# Patient Record
Sex: Female | Born: 1950 | Hispanic: No | Marital: Married | Smoking: Never smoker
Health system: Southern US, Community
[De-identification: ages and names within clinical notes are randomized; demographics above are authoritative.]

## PROBLEM LIST (undated history)

## (undated) DIAGNOSIS — N189 Chronic kidney disease, unspecified: Secondary | ICD-10-CM

## (undated) DIAGNOSIS — E785 Hyperlipidemia, unspecified: Secondary | ICD-10-CM

## (undated) DIAGNOSIS — I10 Essential (primary) hypertension: Secondary | ICD-10-CM

## (undated) DIAGNOSIS — E119 Type 2 diabetes mellitus without complications: Secondary | ICD-10-CM

## (undated) HISTORY — PX: DG THUMB LEFT HAND: HXRAD1658

## (undated) HISTORY — DX: Essential (primary) hypertension: I10

## (undated) HISTORY — DX: Hyperlipidemia, unspecified: E78.5

## (undated) HISTORY — DX: Type 2 diabetes mellitus without complications: E11.9

## (undated) HISTORY — DX: Chronic kidney disease, unspecified: N18.9

---

## 2002-10-28 ENCOUNTER — Other Ambulatory Visit: Admission: RE | Admit: 2002-10-28 | Discharge: 2002-10-28 | Payer: Self-pay | Admitting: Internal Medicine

## 2003-09-23 ENCOUNTER — Encounter: Admission: RE | Admit: 2003-09-23 | Discharge: 2003-12-22 | Payer: Self-pay | Admitting: Internal Medicine

## 2004-01-13 ENCOUNTER — Other Ambulatory Visit: Admission: RE | Admit: 2004-01-13 | Discharge: 2004-01-13 | Payer: Self-pay | Admitting: Internal Medicine

## 2004-01-20 ENCOUNTER — Encounter: Admission: RE | Admit: 2004-01-20 | Discharge: 2004-03-11 | Payer: Self-pay | Admitting: Internal Medicine

## 2005-09-12 ENCOUNTER — Other Ambulatory Visit: Admission: RE | Admit: 2005-09-12 | Discharge: 2005-09-12 | Payer: Self-pay | Admitting: Obstetrics and Gynecology

## 2006-01-26 ENCOUNTER — Encounter: Admission: RE | Admit: 2006-01-26 | Discharge: 2006-04-26 | Payer: Self-pay | Admitting: Family Medicine

## 2006-07-12 ENCOUNTER — Other Ambulatory Visit: Admission: RE | Admit: 2006-07-12 | Discharge: 2006-07-12 | Payer: Self-pay | Admitting: Internal Medicine

## 2009-12-29 ENCOUNTER — Encounter: Admission: RE | Admit: 2009-12-29 | Discharge: 2009-12-29 | Payer: Self-pay | Admitting: Gastroenterology

## 2010-01-30 ENCOUNTER — Encounter: Admission: RE | Admit: 2010-01-30 | Discharge: 2010-01-30 | Payer: Self-pay | Admitting: Orthopedic Surgery

## 2010-12-05 ENCOUNTER — Encounter: Payer: Self-pay | Admitting: Orthopedic Surgery

## 2012-10-17 ENCOUNTER — Other Ambulatory Visit: Payer: Self-pay | Admitting: Family Medicine

## 2012-10-17 DIAGNOSIS — R109 Unspecified abdominal pain: Secondary | ICD-10-CM

## 2012-10-19 ENCOUNTER — Other Ambulatory Visit: Payer: Self-pay

## 2012-10-23 ENCOUNTER — Ambulatory Visit
Admission: RE | Admit: 2012-10-23 | Discharge: 2012-10-23 | Disposition: A | Discharge status: Home / Self Care | Payer: 59 | Source: Ambulatory Visit | Attending: Family Medicine | Admitting: Family Medicine

## 2012-10-23 DIAGNOSIS — R109 Unspecified abdominal pain: Secondary | ICD-10-CM

## 2016-06-08 ENCOUNTER — Other Ambulatory Visit: Payer: Self-pay | Admitting: Cardiovascular Disease

## 2016-06-08 DIAGNOSIS — E782 Mixed hyperlipidemia: Secondary | ICD-10-CM

## 2016-06-08 DIAGNOSIS — I10 Essential (primary) hypertension: Secondary | ICD-10-CM

## 2016-06-08 DIAGNOSIS — Z8249 Family history of ischemic heart disease and other diseases of the circulatory system: Secondary | ICD-10-CM

## 2016-06-13 ENCOUNTER — Other Ambulatory Visit: Payer: Self-pay

## 2016-06-15 ENCOUNTER — Other Ambulatory Visit: Payer: Self-pay

## 2018-06-21 ENCOUNTER — Other Ambulatory Visit (HOSPITAL_COMMUNITY): Payer: Self-pay | Admitting: Nurse Practitioner

## 2018-06-21 DIAGNOSIS — R6881 Early satiety: Secondary | ICD-10-CM

## 2018-08-03 ENCOUNTER — Encounter (HOSPITAL_COMMUNITY): Payer: Self-pay

## 2018-08-03 ENCOUNTER — Ambulatory Visit (HOSPITAL_COMMUNITY)
Admission: RE | Admit: 2018-08-03 | Discharge: 2018-08-03 | Disposition: A | Discharge status: Home / Self Care | Payer: BLUE CROSS/BLUE SHIELD | Source: Ambulatory Visit | Attending: Nurse Practitioner | Admitting: Nurse Practitioner

## 2018-08-03 DIAGNOSIS — R6881 Early satiety: Secondary | ICD-10-CM | POA: Diagnosis not present

## 2018-08-03 MED ORDER — TECHNETIUM TC 99M SULFUR COLLOID
2.1000 | Freq: Once | INTRAVENOUS | Status: AC | PRN
Start: 2018-08-03 — End: 2018-08-03
  Administered 2018-08-03: 2.1 via ORAL

## 2019-06-27 ENCOUNTER — Other Ambulatory Visit: Payer: Self-pay

## 2019-06-27 ENCOUNTER — Ambulatory Visit (HOSPITAL_COMMUNITY)
Admission: RE | Admit: 2019-06-27 | Discharge: 2019-06-27 | Disposition: A | Discharge status: Home / Self Care | Payer: BC Managed Care – PPO | Source: Ambulatory Visit | Attending: Family Medicine | Admitting: Family Medicine

## 2019-06-27 ENCOUNTER — Other Ambulatory Visit (HOSPITAL_COMMUNITY): Payer: Self-pay | Admitting: Family Medicine

## 2019-06-27 DIAGNOSIS — M79605 Pain in left leg: Secondary | ICD-10-CM | POA: Diagnosis not present

## 2019-06-27 DIAGNOSIS — M7989 Other specified soft tissue disorders: Secondary | ICD-10-CM | POA: Diagnosis present

## 2019-06-27 NOTE — Progress Notes (Signed)
Left lower extremity venous duplex completed. Refer to "CV Proc" under chart review to view preliminary results.  06/27/2019 4:46 PM Maudry Mayhew, MHA, RVT, RDCS, RDMS

## 2019-11-01 ENCOUNTER — Ambulatory Visit: Payer: BC Managed Care – PPO | Attending: Internal Medicine

## 2019-11-01 DIAGNOSIS — Z20822 Contact with and (suspected) exposure to covid-19: Secondary | ICD-10-CM

## 2019-11-02 LAB — NOVEL CORONAVIRUS, NAA: SARS-CoV-2, NAA: NOT DETECTED

## 2019-12-23 ENCOUNTER — Ambulatory Visit: Payer: BC Managed Care – PPO

## 2020-02-18 NOTE — Progress Notes (Signed)
Patient referred by Berkley Harvey, NP for palpitations  Subjective:   Tonya Thornton, female    DOB: Feb 21, 1951, 69 y.o.   MRN: [id number]   Chief Complaint  Patient presents with  . Palpitations  . New Patient (Initial Visit)     HPI  69 y.o. [name] with hypertension, type 2 diabetes mellitus, hyperlipidemia, solitary kidney, family history of brain aneurysm, referred for evaluation of palpitations.  She has had several complaints over the last year or so. This includes bilareral leg/calf pain, generalized fatigue, intolerance to cold, long standing headache, and now palpitations and elevated blood pressure. Headache and elevated blood pressure seem to correlate with inadequate sleep. She denies any chest pain or shortness of breath. Patient has uncontrolled hypertension and diabetes. There does seem to be variability to her blood pressure readings, worse after inadequate sleep at night. She was recently told to have PVS on EKG performed by her PCP.   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 Patient enjoys walking, walks up to 2 miles over an hour or so.  She does have family h/o CAD, with her brother having had a MI related cardiac arrest recently. She also has family h.o brain aneurysm, but reportedly underwent brain MRI herself which was negative for aneurysm. She has a lot of anxiety related to her symptoms and worries what would happen if she has similar episodes "while walking on the street".    Past Medical History:  Diagnosis Date  . Chronic kidney disease   . Diabetes mellitus without complication ([place])   . Hyperlipidemia   . Hypertension      Past Surgical History:  Procedure Laterality Date  . DG THUMB LEFT HAND       Social History   Tobacco Use  Smoking Status Never Smoker  Smokeless Tobacco Never Used    Social History   Substance and Sexual Activity  Alcohol Use Not Currently     Family History    Problem Relation Age of Onset  . COPD Mother   . Diabetes Father   . Hypertension Father   . Diabetes Sister   . Liver cancer Brother   . Liver cancer Brother   . Cancer Brother   . Cancer Sister   . Diabetes Sister   . Diabetes Sister      Current Outpatient Medications on File Prior to Visit  Medication Sig Dispense Refill  . aspirin EC 81 MG tablet Take 81 mg by mouth daily.    . Calcium Carbonate-Vitamin D (CALCIUM 600+D PO) Take 1 tablet by mouth daily.    . Cholecalciferol 25 MCG (1000 UT) capsule Take 1 capsule by mouth daily.    . diclofenac Sodium (VOLTAREN) 1 % GEL APPLY TO AFFECTED AREA 2 GRAMS 2 TO 3 TIMES DAILY AS NEEDED    . lisinopril-hydrochlorothiazide (ZESTORETIC) 10-12.5 MG tablet Take 1 tablet by mouth daily.    Marland Kitchen MAGNESIUM CHLORIDE PO Take 1 tablet by mouth daily.    . metFORMIN (GLUCOPHAGE-XR) 500 MG 24 hr tablet Take 3 tablets by mouth daily.    . Multiple Vitamin (MULTIVITAMIN) tablet Take 1 tablet by mouth daily.    . pravastatin (PRAVACHOL) 20 MG tablet Take 1 tablet by mouth daily.     No current facility-administered medications on file prior to visit.    Cardiovascular and other pertinent studies:  EKG 02/19/2020: Sinus rhythm 89 bpm. Nonspecific T wave ivnersion.    Recent labs: 02/13/2020: Glucose  136, BUN/Cr 11/0.74 EGFR normal. Na/K 136/4.5. Rest of the CMP normal H/H 12/38. MCV 84. Platelets 196 Chol 170, TG 97, HDL 57, LDL 94 TSH 3.3 normal   10/2019: HbA1C 7.8%  Review of Systems  Cardiovascular: Positive for palpitations. Negative for chest pain, dyspnea on exertion, leg swelling and syncope.  Neurological: Positive for headaches.  Psychiatric/Behavioral: The patient is nervous/anxious.         Vitals:   02/19/20 1058 02/19/20 1110  BP: (!) 158/111 (!) 173/97  Pulse: 87 (!) 101  Resp: 18   Temp: 98.2 F (36.8 C)   SpO2: 99%      Body mass index is 24.66 kg/m. Filed Weights   02/19/20 1058  Weight: 130 lb 8 oz  (59.2 kg)     Objective:   Physical Exam  Constitutional: She appears well-developed and well-nourished.  Neck: No JVD present.  Cardiovascular: Normal rate, regular rhythm, normal heart sounds and intact distal pulses.  No murmur heard. Pulses:      Dorsalis pedis pulses are 2+ on the right side and 2+ on the left side.       Posterior tibial pulses are 2+ on the right side and 2+ on the left side.  Pulmonary/Chest: Effort normal and breath sounds normal. She has no wheezes. She has no rales.  Musculoskeletal:        General: No edema.  Nursing note and vitals reviewed.       Assessment & Recommendations:   69 y.o. [name] with hypertension, type 2 diabetes mellitus, hyperlipidemia, family history of brain aneurysm, referred for evaluation of palpitations.  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  Recommend 14 day cardiac telemetry to assess PVC burden. Started metoprolol tartarate 25 mg bid.  Hypertension: Uncontrolled. There is significant variation in her blood pressure readings. Arranged for remote patient monitoring. Continue lisinopril-HCTZ 10-12.5 mg for now. I would avoid increasing ACEi dose if possible, as she has congenital solitary kidney.   Cardiac risk stratification: Given her risk factors for CAD and family h/o MI related cardiac arrest, I will obtain calcium score scan. Conitnue Aspirin, statin, risk factor modification.  Type 2 DM: Continue management as per PCP.  F/u in 6 weeks  Thank you for referring the patient to [place]. Please feel free to contact with any questions.  Time spent: 60 min  [place], MD [facility] Pager: [phone] Office: [phone]

## 2020-02-19 ENCOUNTER — Ambulatory Visit: Payer: BC Managed Care – PPO | Admitting: Cardiology

## 2020-02-19 ENCOUNTER — Encounter: Payer: Self-pay | Admitting: Cardiology

## 2020-02-19 ENCOUNTER — Other Ambulatory Visit: Payer: Self-pay

## 2020-02-19 VITALS — BP 173/97 | HR 101 | Temp 98.2°F | Resp 18 | Ht 61.0 in | Wt 130.5 lb

## 2020-02-19 DIAGNOSIS — E782 Mixed hyperlipidemia: Secondary | ICD-10-CM

## 2020-02-19 DIAGNOSIS — Q600 Renal agenesis, unilateral: Secondary | ICD-10-CM

## 2020-02-19 DIAGNOSIS — Z8249 Family history of ischemic heart disease and other diseases of the circulatory system: Secondary | ICD-10-CM | POA: Insufficient documentation

## 2020-02-19 DIAGNOSIS — IMO0002 Reserved for concepts with insufficient information to code with codable children: Secondary | ICD-10-CM | POA: Insufficient documentation

## 2020-02-19 DIAGNOSIS — I10 Essential (primary) hypertension: Secondary | ICD-10-CM | POA: Insufficient documentation

## 2020-02-19 DIAGNOSIS — R002 Palpitations: Secondary | ICD-10-CM | POA: Insufficient documentation

## 2020-02-19 DIAGNOSIS — I493 Ventricular premature depolarization: Secondary | ICD-10-CM

## 2020-02-19 MED ORDER — METOPROLOL TARTRATE 25 MG PO TABS: 25.0000 mg | ORAL_TABLET | Freq: Two times a day (BID) | ORAL | 2 refills | Status: DC

## 2020-02-21 ENCOUNTER — Telehealth: Payer: Self-pay

## 2020-02-21 NOTE — Telephone Encounter (Signed)
Patient wants to know can she skip taking Lisinopril-HCTZ medication if her blood pressure is in the normal range 120/80's. She is concerned that it will drop to low with the addition of Metoprolol.

## 2020-02-21 NOTE — Telephone Encounter (Signed)
Yes. Okay with me.

## 2020-02-24 NOTE — Telephone Encounter (Signed)
Called pt no answer °

## 2020-02-26 ENCOUNTER — Other Ambulatory Visit: Payer: Self-pay

## 2020-02-26 ENCOUNTER — Ambulatory Visit: Payer: BC Managed Care – PPO

## 2020-02-26 DIAGNOSIS — I493 Ventricular premature depolarization: Secondary | ICD-10-CM

## 2020-02-26 DIAGNOSIS — I10 Essential (primary) hypertension: Secondary | ICD-10-CM

## 2020-02-26 DIAGNOSIS — R002 Palpitations: Secondary | ICD-10-CM

## 2020-02-26 NOTE — Telephone Encounter (Signed)
LMTCB

## 2020-02-27 ENCOUNTER — Telehealth: Payer: Self-pay

## 2020-02-27 DIAGNOSIS — E782 Mixed hyperlipidemia: Secondary | ICD-10-CM

## 2020-02-27 MED ORDER — ROSUVASTATIN CALCIUM 20 MG PO TABS: 20.0000 mg | ORAL_TABLET | Freq: Every day | ORAL | 3 refills | Status: DC

## 2020-02-27 NOTE — Telephone Encounter (Signed)
Gave patient her calcium score result, and discussed Dr. Damian Leavell answers to her previous questions. Patient verbalized understanding.

## 2020-02-27 NOTE — Telephone Encounter (Signed)
-----   Message from Morley Kos, [place] sent at 02/27/2020 11:43 AM EDT ----- Normal pumping function of the heart. No severe heart valve abnormalities noted.   Thanks  MJP

## 2020-02-27 NOTE — Telephone Encounter (Signed)
Patient wants to know what her calcium score is. Also, She wants to know if her elevated Bp is due to the calcium build up in her arteries.

## 2020-02-27 NOTE — Progress Notes (Signed)
Normal pumping function of the heart. No severe heart valve abnormalities noted.  ° ° °Thanks °MJP ° °

## 2020-02-27 NOTE — Telephone Encounter (Signed)
-----   Message from [facility], MD sent at 02/26/2020  4:57 PM EDT ----- Moderate calcium noted in heart arteries. Nothing worrisome, but recommend switching pravastatin to a stronger statin to reduce progression. If patient agreeable, please send Crestor 20 mg daily prescription, 30 pillsX2 refills.  Thanks MJP

## 2020-02-27 NOTE — Telephone Encounter (Signed)
Calcium score is 102, which is 50-75th percentile. No calcium in left main coronary artery, which is good.   Yes, elevated blood pressure is likely due to "hardening" of the arteries.  Thanks MJP

## 2020-03-05 ENCOUNTER — Ambulatory Visit: Payer: Self-pay

## 2020-03-05 ENCOUNTER — Other Ambulatory Visit: Payer: Self-pay

## 2020-03-05 ENCOUNTER — Ambulatory Visit (INDEPENDENT_AMBULATORY_CARE_PROVIDER_SITE_OTHER): Payer: Medicare HMO | Admitting: Sports Medicine

## 2020-03-05 VITALS — BP 132/80 | Ht 62.0 in | Wt 129.0 lb

## 2020-03-05 DIAGNOSIS — M25561 Pain in right knee: Secondary | ICD-10-CM

## 2020-03-05 DIAGNOSIS — M1711 Unilateral primary osteoarthritis, right knee: Secondary | ICD-10-CM

## 2020-03-05 NOTE — Patient Instructions (Addendum)
It was great to see you today!  -You have mild osteoarthritis of your left knee. -You have osteoarthritis, a degenerative medial meniscus tear and cyst in your right knee. -Take 2-3 short walks a day as pain allows. -Make sure you ice your knee right after you walk. Do this for 15 minutes. -Do the exercises we showed you today. Try to do them daily. -Wear the knee sleeve when you are up and walking around.  -See [place] again in 4-6 weeks to re-evaluate your knee and repeat the ultrasound. -See [place] sooner for your right shoulder pain.

## 2020-03-07 DIAGNOSIS — M1711 Unilateral primary osteoarthritis, right knee: Secondary | ICD-10-CM | POA: Insufficient documentation

## 2020-03-07 NOTE — Assessment & Plan Note (Signed)
With single kidney and DM avoid NSAIDs Repetitive icing Compression sleeve (she has one that is cloth and not adequate for support) Walking pattern - cut to shorter walks and more frequent ones rather than prolonged Keep pain and tightness to mild HEP of SLR and isometrics  Would like to avoid CSI with her DM but we can do so if pain worsens  Reck 6 weeks

## 2020-03-07 NOTE — Progress Notes (Signed)
CC: Right Knee Pain  Patient likes to stay active with walking For past 2 months walking more painful sometimes in both knees She recently retired from computer work at [employer] While working she would have to get up every hour 2/2 knee stiffness and pain Difficult to go up steps on RT knee A couple of weeks ago she felt a pop and had worse pain Felt that the knee might give out More swelling noted since then  Past Hx Recent palpitations with noted PVCs - better on Lopressor Type 2 DM - recent AIC ~ 7 HTN Single kidney  Social History Tonya Thornton who referred her to me Non smoker  ROS No locking of knee Swelling but no redness Feels unstable with twisting  PE Pleaasnt [place] F in NAD BP 132/80   Ht 5\' 2"  (1.575 m)   Wt 129 lb (58.5 kg)   BMI 23.59 kg/m   RT knee Visible effusion in SPP Flexion painful at 130 deg but full extension Stable ligaments Strength is good  Lt Knee No effusion noted Full extension and flexion to 145 deg Stable ligaments  Ultrasound of Right Knee  Moderate hypoechoic swelling of SPP consistent with effusion Free fragment of cartilage and hypoechoic pseudocyst along medial joint line Spurring noted along joint line and narrowing Lateral joint line with smaller spurs noted Meniscus looks intact Patellar and quadriceps tendons normal  Comparison view of left knee shows no effusion and only minor spurs along joint lines  Impression: Findings consistent with degenerative medial meniscus tear and osteoarthritis of RT knee

## 2020-03-19 ENCOUNTER — Ambulatory Visit: Payer: BC Managed Care – PPO | Admitting: Sports Medicine

## 2020-04-02 ENCOUNTER — Ambulatory Visit: Payer: Medicare HMO | Admitting: Sports Medicine

## 2020-04-04 NOTE — Progress Notes (Signed)
Patient referred by Berkley Harvey, NP for palpitations  Subjective:   Tonya Thornton, female    DOB: Nov 02, 1951, 69 y.o.   MRN: [id number]   Chief Complaint  Patient presents with  . Hypertension  . Palpitations  . Follow-up     HPI  69 y.o. [name] with hypertension, type 2 diabetes mellitus, hyperlipidemia, solitary kidney, family history of brain aneurysm, palpitations.  Blood pressure and palpitations much better controlled with metoprolol. Calcium score elevated, details below. She denies any exertional chest pain, shortness of breath. She recently underwent right knee surgery and is recovering from it.   Initial consultation HPI 02/2020: She has had several complaints over the last year or so. This includes bilareral leg/calf pain, generalized fatigue, intolerance to cold, long standing headache, and now palpitations and elevated blood pressure. Headache and elevated blood pressure seem to correlate with inadequate sleep. She denies any chest pain or shortness of breath. Patient has uncontrolled hypertension and diabetes. There does seem to be variability to her blood pressure readings, worse after inadequate sleep at night. She was recently told to have PVS on EKG performed by her PCP.   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 Patient enjoys walking, walks up to 2 miles over an hour or so.  She does have family h/o CAD, with her brother having had a MI related cardiac arrest recently. She also has family h.o brain aneurysm, but reportedly underwent brain MRI herself which was negative for aneurysm. She has a lot of anxiety related to her symptoms and worries what would happen if she has similar episodes "while walking on the street".     Current Outpatient Medications on File Prior to Visit  Medication Sig Dispense Refill  . aspirin EC 81 MG tablet Take 81 mg by mouth daily.    . Calcium Carbonate-Vitamin D (CALCIUM 600+D PO) Take 1  tablet by mouth daily.    . Cholecalciferol 25 MCG (1000 UT) capsule Take 1 capsule by mouth daily.    . diclofenac Sodium (VOLTAREN) 1 % GEL APPLY TO AFFECTED AREA 2 GRAMS 2 TO 3 TIMES DAILY AS NEEDED    . lisinopril-hydrochlorothiazide (ZESTORETIC) 10-12.5 MG tablet Take 1 tablet by mouth daily.    Marland Kitchen MAGNESIUM CHLORIDE PO Take 1 tablet by mouth daily.    . metFORMIN (GLUCOPHAGE-XR) 500 MG 24 hr tablet Take 3 tablets by mouth daily.    . metoprolol tartrate (LOPRESSOR) 25 MG tablet Take 1 tablet (25 mg total) by mouth 2 (two) times daily. 60 tablet 2  . Multiple Vitamin (MULTIVITAMIN) tablet Take 1 tablet by mouth daily.    . rosuvastatin (CRESTOR) 20 MG tablet Take 1 tablet (20 mg total) by mouth daily. 30 tablet 3   No current facility-administered medications on file prior to visit.    Cardiovascular and other pertinent studies:  Real time outpatient cardiac telemetry 02/26/2020: Monitoring period 319 hr 58 min Dominant rhythm: Sinus. HR 57-117 bpm. Avg HR 75 bpm. Ventricular ectopy 1.3%, with isolated VE and one episode of 6 beat NSVT Rare supraventricular ectopy No atrial fibrillation/atrial flutter/SVT/VT/high grade AV block, sinus pause >3sec noted. Symptoms reported: None  Echocardiogram 02/26/2020:   Normal LV systolic function with EF 61%. Left ventricle cavity is normal  in size. Normal left ventricular wall thickness. Normal global wall  motion. Normal diastolic filling pattern. Calculated EF 61%.  Trileaflet aortic valve. Trace aortic regurgitation.  Structurally normal mitral valve. Mild (Grade I) mitral  regurgitation.  CT cardiac scoring 02/25/2020: Total score: 102 LM: 0 LAD: 86 LCx: 0 RCA: 16  EKG 02/19/2020: Sinus rhythm 89 bpm. Nonspecific T wave ivnersion.   Recent labs: 02/13/2020: Glucose 136, BUN/Cr 11/0.74 EGFR normal. Na/K 136/4.5. Rest of the CMP normal H/H 12/38. MCV 84. Platelets 196 Chol 170, TG 97, HDL 57, LDL 94 TSH 3.3  normal   10/2019: HbA1C 7.8%  Review of Systems  Cardiovascular: Positive for palpitations. Negative for chest pain, dyspnea on exertion, leg swelling and syncope.  Neurological: Positive for headaches.  Psychiatric/Behavioral: The patient is nervous/anxious.         Vitals:   04/06/20 1445  BP: 139/80  Pulse: 71  Resp: 17  SpO2: 99%     Body mass index is 23.78 kg/m. Filed Weights   04/06/20 1445  Weight: 130 lb (59 kg)     Objective:   Physical Exam  Constitutional: She appears well-developed and well-nourished.  Neck: No JVD present.  Cardiovascular: Normal rate, regular rhythm, normal heart sounds and intact distal pulses.  No murmur heard. Pulses:      Dorsalis pedis pulses are 2+ on the right side and 2+ on the left side.       Posterior tibial pulses are 2+ on the right side and 2+ on the left side.  Pulmonary/Chest: Effort normal and breath sounds normal. She has no wheezes. She has no rales.  Musculoskeletal:        General: No edema.  Nursing note and vitals reviewed.       Assessment & Recommendations:   69 y.o. [name] with hypertension, type 2 diabetes mellitus, hyperlipidemia, solitary kidney, family history of brain aneurysm, with  elevated calcium score, symptomatic PVC's  Elevated calcium score: No angina symptoms. Recommend aggressive medial therapy.  Increased crestor to 40 mg. Continue Aspirin 81 mg. Continue hypertension control.   Symptomatic PVC's: Now improved on metoprolol tartarate.  Hypertension: Now controlled.  Type 2 DM: Continue management as per PCP.  F/u in 3 months after repeat lipid panel  Nigel Mormon, MD [facility] Pager: [phone] Office: [phone]

## 2020-04-06 ENCOUNTER — Encounter: Payer: Self-pay | Admitting: Cardiology

## 2020-04-06 ENCOUNTER — Other Ambulatory Visit: Payer: Self-pay

## 2020-04-06 ENCOUNTER — Ambulatory Visit: Payer: Medicare HMO | Admitting: Cardiology

## 2020-04-06 VITALS — BP 139/80 | HR 71 | Resp 17 | Ht 62.0 in | Wt 130.0 lb

## 2020-04-06 DIAGNOSIS — I10 Essential (primary) hypertension: Secondary | ICD-10-CM

## 2020-04-06 DIAGNOSIS — R931 Abnormal findings on diagnostic imaging of heart and coronary circulation: Secondary | ICD-10-CM | POA: Insufficient documentation

## 2020-04-06 DIAGNOSIS — IMO0002 Reserved for concepts with insufficient information to code with codable children: Secondary | ICD-10-CM

## 2020-04-06 DIAGNOSIS — E782 Mixed hyperlipidemia: Secondary | ICD-10-CM

## 2020-04-06 DIAGNOSIS — I493 Ventricular premature depolarization: Secondary | ICD-10-CM

## 2020-04-06 MED ORDER — ROSUVASTATIN CALCIUM 40 MG PO TABS
40.0000 mg | ORAL_TABLET | Freq: Every day | ORAL | 3 refills | Status: DC
Start: 2020-04-06 — End: 2020-06-16

## 2020-04-07 ENCOUNTER — Encounter: Payer: Self-pay | Admitting: Sports Medicine

## 2020-04-07 ENCOUNTER — Ambulatory Visit (INDEPENDENT_AMBULATORY_CARE_PROVIDER_SITE_OTHER): Payer: Medicare HMO | Admitting: Sports Medicine

## 2020-04-07 ENCOUNTER — Ambulatory Visit: Payer: Self-pay

## 2020-04-07 VITALS — BP 140/74 | Ht 62.0 in | Wt 130.0 lb

## 2020-04-07 DIAGNOSIS — G8929 Other chronic pain: Secondary | ICD-10-CM | POA: Diagnosis not present

## 2020-04-07 DIAGNOSIS — M25511 Pain in right shoulder: Secondary | ICD-10-CM

## 2020-04-07 DIAGNOSIS — M1711 Unilateral primary osteoarthritis, right knee: Secondary | ICD-10-CM | POA: Diagnosis not present

## 2020-04-07 NOTE — Patient Instructions (Signed)
Try Tumeric 500 mg once daily. Follow up in 6 weeks.

## 2020-04-07 NOTE — Progress Notes (Signed)
[facility] [address] [place], [place] [address] Phone: [phone] Fax: [phone]   Patient Name: Tonya Thornton Date of Birth: 07-28-1951 Medical Record Number: [id number] Gender: female Date of Encounter: 04/07/2020  SUBJECTIVE:      Chief Complaint:  Follow-up right knee and right shoulder pain   HPI:  Patient is still having pain in her right knee, states it has not worsened.  She is utilizing a cane to ambulate.  She has increased pain when walking up an incline or stairs.  She does still have intermittent locking and catching.  She feels this leg is weaker compared to her left side.  She has been doing her home exercise program diligently.  She denies any numbness or tingling into her toes.  She feels her right lower leg has had a little more swelling. RHD female.  Last fall patient had a fall down the stairs that resulted in a injury to her right shoulder.  States she had an MRI of the right shoulder that showed a subscapularis tear with some sort of biceps pathology as well.  She did 2 months of physical therapy and it improved.  Over the last 2 months she has noticed more pain in the area and has noticed a numbing sensation that will start in her upper arm down the radial side into her thumb.  She was considering having surgery to repair the tendon earlier this year, but her hemoglobin A1c was too high to move forward with surgery.     ROS:     See HPI.   PERTINENT  PMH / PSH / FH / SH:  Past Medical, Surgical, Social, and Family History Reviewed & Updated in the EMR. Pertinent findings include:  OA of right knee, penicillin allergy, taking vitamin D and calcium, DMII   OBJECTIVE:  BP 140/74   Ht 5\' 2"  (1.575 m)   Wt 130 lb (59 kg)   BMI 23.78 kg/m  Physical Exam:  Vital signs are reviewed.   GEN: Alert and oriented, NAD Pulm: Breathing unlabored PSY: normal mood, congruent affect  MSK: Right knee: Noted atrophy of  quadriceps muscle compared to contralateral side on visual examination TTP to bilateral joint lines Decreased ROM in knee flexion Ligaments with solid consistent endpoints including ACL, PCL, LCL, MCL. Positive Mcmurray's and Thessaly tests. Patellar and quadriceps tendons unremarkable. Hamstring and quadriceps strength is normal.  Neurovascularly intact.  Right shoulder Well developed, well nourished, in no acute distress. No swelling, ecchymoses.  No gross deformity. No TTP. FROM. Strength 4/5 in abduction, elbow flexion, horizontal abduction Negative Hawkins, Neers. Negative Yergasons. Negative apprehension. Positive crossarm test NV intact distally.  Limited MSK Ultrasound: Right shoulder No evidence of joint effusion.   The biceps brachii long head tendon and short axis demonstrates moderate swelling around tendon Subscapularis tendon shows intrasubstance tear Supraspinatus insertion shows mild hypoechoic changes Teres minor and infraspinatus visualized without abnormality subacromial bursal abnormality with effusion Posterior labrum is unremarkable AC joint visualized in the long axis with effusion  Impression: Intrasubstance subscapularis tear with partial biceps tear and swelling with AC joint effusion  Ultrasound performed and interpreted by myself and Dr. Peterson Ao B.  Fields, MD   ASSESSMENT & PLAN:   1. Right knee pain  She has improved since last visit, so we will continue with aggressive home therapy and compression.  She can continue to use turmeric.  We advised her to start using Voltaren gel.  She will follow-up with [place]  in 6 weeks.  2.  Chronic right shoulder pain  She is likely compensating for internal rotation with her pectoralis muscle.  She has full function of the shoulder, so at this time we will focus on rotator cuff and bicep strengthening to help her symptoms.  The numbness is likely a result of the swelling pushing on the nerve in the shoulder.  We  will see her back in 6 weeks for reassessment.   Judge Stall, DO, ATC Sports Medicine Fellow  I observed and examined the patient with Dr. Ruta Hinds and agree with assessment and plan.  Note reviewed and modified by me. Sterling Big, MD

## 2020-04-22 ENCOUNTER — Other Ambulatory Visit: Payer: Self-pay

## 2020-04-22 MED ORDER — AMBULATORY NON FORMULARY MEDICATION: 0 refills | Status: DC

## 2020-04-22 NOTE — Progress Notes (Signed)
Pt called asking for a script for a wheel chair for an upcoming trip since she has trouble when doing a lot of walking. She would like it faxed to [facility] in [facility].

## 2020-05-12 ENCOUNTER — Other Ambulatory Visit: Payer: Self-pay | Admitting: Cardiology

## 2020-05-12 ENCOUNTER — Other Ambulatory Visit: Payer: Self-pay | Admitting: Pharmacist

## 2020-05-12 DIAGNOSIS — R002 Palpitations: Secondary | ICD-10-CM

## 2020-05-12 DIAGNOSIS — I10 Essential (primary) hypertension: Secondary | ICD-10-CM

## 2020-05-19 ENCOUNTER — Ambulatory Visit: Payer: Medicare HMO | Admitting: Sports Medicine

## 2020-05-26 ENCOUNTER — Ambulatory Visit (INDEPENDENT_AMBULATORY_CARE_PROVIDER_SITE_OTHER): Payer: Medicare HMO | Admitting: Sports Medicine

## 2020-05-26 ENCOUNTER — Encounter: Payer: Self-pay | Admitting: Sports Medicine

## 2020-05-26 ENCOUNTER — Other Ambulatory Visit: Payer: Self-pay

## 2020-05-26 DIAGNOSIS — M1711 Unilateral primary osteoarthritis, right knee: Secondary | ICD-10-CM | POA: Diagnosis not present

## 2020-05-26 DIAGNOSIS — M25511 Pain in right shoulder: Secondary | ICD-10-CM | POA: Insufficient documentation

## 2020-05-26 DIAGNOSIS — M5136 Other intervertebral disc degeneration, lumbar region: Secondary | ICD-10-CM | POA: Diagnosis not present

## 2020-05-26 NOTE — Assessment & Plan Note (Signed)
Continue to use knee compression when standing or walking Start using your Voltaren gel as this will not harm your kidneys and topical form Continue turmeric Okay to use a wheelchair for rest with longer walking or standing  Okay to continue walking to your pain tolerance Recheck in 3 months

## 2020-05-26 NOTE — Patient Instructions (Signed)
It was great to see you today!  -Start using the voltaren gel again. -Use it at least twice a day on your knee. You can use it up to 4 times a day. -Consider looking at hiking sticks at [employer] They are lightweight and might help you walk with better balance. -Continue Physical Therapy, but when you have used up your visits you can reach out to [facility] at [facility] [employer]. We gave you her card. -For long trips a wheelchair is reasonable to keep up. -Try to keep up activity as best as you can. -Talk to your primary care physician about getting the wheelchair. -See the handout for the exercises we went over with you today.

## 2020-05-26 NOTE — Progress Notes (Signed)
Chief complaint right shoulder pain and low back pain  Patient seen by me 04/07/2020 for right knee arthritis She was at the time having to walk with a cane I suggested a series of exercises We also started formal physical therapy which is really helped  She thinks that walking abnormally has gradually triggered a lot of low back pain She has a history of degenerative disc disease documented on an MRI first in 2011 She states she has had more recent MRIs that showed extension of this Pain occurs with prolonged standing or too much walking  Right shoulder has also been painful She does feel that the physical therapy exercises are helping this  She has started on turmeric for her musculoskeletal pain and thinks it may help somewhat She feels that the Voltaren gel helps but she was hesitant to use too much because of her diabetes  She borrowed a wheelchair for couple of long trips that involved a lot of walking and this really helped Both the back and the knee become very painful if she walks or stands too much  ROS She has reduced walking because of right knee pain but does continue trying to walk some every day She has night pain that makes sleep difficult at times this comes from her low back  Physical examination Pleasant [place] female in no acute distress BP 115/78   Ht 5\' 2"  (1.575 m)   Wt 130 lb (59 kg)   BMI 23.78 kg/m   RT shoulder Mild pain with impingement testing Overall good range of motion Good strength to testing Positive pain on crossover test  Right knee is warm with an obvious suprapatellar effusion Good flexion and extension Left knee does not seem to have an effusion and also has good flexion and extension  Williams flexion exercises tend to relieve some of her back pain She is able to do a branch with no pain

## 2020-05-26 NOTE — Assessment & Plan Note (Signed)
She is doing some exercises for the shoulder which seem helpful I think working with PT is helpful I would like to do more assessment of this in the future including ultrasound scanning

## 2020-05-26 NOTE — Assessment & Plan Note (Signed)
Start with a home flexion program Use pelvic tilts Knee to chest Knee to opposite shoulder Work with physical therapy  Recheck 3 months

## 2020-06-16 ENCOUNTER — Telehealth: Payer: Self-pay

## 2020-06-16 ENCOUNTER — Other Ambulatory Visit: Payer: Self-pay

## 2020-06-16 DIAGNOSIS — E782 Mixed hyperlipidemia: Secondary | ICD-10-CM

## 2020-06-16 MED ORDER — ROSUVASTATIN CALCIUM 20 MG PO TABS
40.0000 mg | ORAL_TABLET | Freq: Every day | ORAL | 3 refills | Status: DC
Start: 2020-06-16 — End: 2020-06-23

## 2020-06-16 NOTE — Telephone Encounter (Signed)
Okay to reduce Crestor to 20 mg.  Thanks MJP

## 2020-06-16 NOTE — Telephone Encounter (Signed)
Pt called and would like to know can she take a lower dose of her Cestor 20 instead 40 due that her PCP did a lipid test and her  LDL 35

## 2020-06-20 ENCOUNTER — Other Ambulatory Visit: Payer: Self-pay | Admitting: Cardiology

## 2020-06-20 DIAGNOSIS — E782 Mixed hyperlipidemia: Secondary | ICD-10-CM

## 2020-07-10 ENCOUNTER — Encounter: Payer: Self-pay | Admitting: Cardiology

## 2020-07-10 ENCOUNTER — Other Ambulatory Visit: Payer: Self-pay

## 2020-07-10 ENCOUNTER — Ambulatory Visit: Payer: BC Managed Care – PPO | Admitting: Cardiology

## 2020-07-10 VITALS — BP 143/78 | HR 76 | Resp 17 | Ht 62.0 in | Wt 128.0 lb

## 2020-07-10 DIAGNOSIS — I493 Ventricular premature depolarization: Secondary | ICD-10-CM

## 2020-07-10 DIAGNOSIS — I10 Essential (primary) hypertension: Secondary | ICD-10-CM

## 2020-07-10 DIAGNOSIS — IMO0002 Reserved for concepts with insufficient information to code with codable children: Secondary | ICD-10-CM

## 2020-07-10 DIAGNOSIS — R931 Abnormal findings on diagnostic imaging of heart and coronary circulation: Secondary | ICD-10-CM

## 2020-07-10 DIAGNOSIS — M79605 Pain in left leg: Secondary | ICD-10-CM

## 2020-07-10 DIAGNOSIS — E782 Mixed hyperlipidemia: Secondary | ICD-10-CM

## 2020-07-10 DIAGNOSIS — M79604 Pain in right leg: Secondary | ICD-10-CM | POA: Insufficient documentation

## 2020-07-10 NOTE — Progress Notes (Signed)
Patient referred by Berkley Harvey, NP for palpitations  Subjective:   Tonya Thornton, female    DOB: Dec 18, 1950, 69 y.o.   MRN: [id number]   Chief Complaint  Patient presents with  . PVCs  . Hyperlipidemia  . Follow-up    3 month     HPI  69 y.o. [name] with hypertension, type 2 diabetes mellitus, hyperlipidemia, solitary kidney, family history of brain aneurysm, palpitations.  Blood pressure and palpitations much better controlled with metoprolol. Calcium score elevated, details below. She is compliant with medical therapy, with improvement in blood pressures. Avg BP has been 114/72 mmHg, HR 75 bpm. She reports bilateral calf pain, L>R, worse with walking.    Initial consultation HPI 02/2020: She has had several complaints over the last year or so. This includes bilareral leg/calf pain, generalized fatigue, intolerance to cold, long standing headache, and now palpitations and elevated blood pressure. Headache and elevated blood pressure seem to correlate with inadequate sleep. She denies any chest pain or shortness of breath. Patient has uncontrolled hypertension and diabetes. There does seem to be variability to her blood pressure readings, worse after inadequate sleep at night. She was recently told to have PVS on EKG performed by her PCP.   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 Patient enjoys walking, walks up to 2 miles over an hour or so.  She does have family h/o CAD, with her brother having had a MI related cardiac arrest recently. She also has family h.o brain aneurysm, but reportedly underwent brain MRI herself which was negative for aneurysm. She has a lot of anxiety related to her symptoms and worries what would happen if she has similar episodes "while walking on the street".     Current Outpatient Medications on File Prior to Visit  Medication Sig Dispense Refill  . AMBULATORY NON FORMULARY MEDICATION Please provide  patient with a wheelchair that has brakes and a back cushion. 1 Units 0  . aspirin EC 81 MG tablet Take 81 mg by mouth daily.    . Calcium Carbonate-Vitamin D (CALCIUM 600+D PO) Take 1 tablet by mouth daily.    . Cholecalciferol 25 MCG (1000 UT) capsule Take 1 capsule by mouth daily.    . diclofenac Sodium (VOLTAREN) 1 % GEL APPLY TO AFFECTED AREA 2 GRAMS 2 TO 3 TIMES DAILY AS NEEDED    . lisinopril-hydrochlorothiazide (ZESTORETIC) 10-12.5 MG tablet Take 1 tablet by mouth daily.    Marland Kitchen MAGNESIUM CHLORIDE PO Take 1 tablet by mouth daily.    . metFORMIN (GLUCOPHAGE-XR) 500 MG 24 hr tablet Take 3 tablets by mouth daily.    . metoprolol tartrate (LOPRESSOR) 25 MG tablet TAKE 1 TABLET BY MOUTH TWICE A DAY 180 tablet 0  . Multiple Vitamin (MULTIVITAMIN) tablet Take 1 tablet by mouth daily.    . rosuvastatin (CRESTOR) 20 MG tablet TAKE 1 TABLET BY MOUTH EVERY DAY 90 tablet 3   No current facility-administered medications on file prior to visit.    Cardiovascular and other pertinent studies:  Real time outpatient cardiac telemetry 02/26/2020: Monitoring period 319 hr 58 min Dominant rhythm: Sinus. HR 57-117 bpm. Avg HR 75 bpm. Ventricular ectopy 1.3%, with isolated VE and one episode of 6 beat NSVT Rare supraventricular ectopy No atrial fibrillation/atrial flutter/SVT/VT/high grade AV block, sinus pause >3sec noted. Symptoms reported: None  Echocardiogram 02/26/2020:   Normal LV systolic function with EF 61%. Left ventricle cavity is normal  in size. Normal left  ventricular wall thickness. Normal global wall  motion. Normal diastolic filling pattern. Calculated EF 61%.  Trileaflet aortic valve. Trace aortic regurgitation.  Structurally normal mitral valve. Mild (Grade I) mitral regurgitation.  CT cardiac scoring 02/25/2020: Total score: 102 LM: 0 LAD: 86 LCx: 0 RCA: 16  EKG 02/19/2020: Sinus rhythm 89 bpm. Nonspecific T wave ivnersion.   Recent labs: 02/13/2020: Glucose 136, BUN/Cr  11/0.74 EGFR normal. Na/K 136/4.5. Rest of the CMP normal H/H 12/38. MCV 84. Platelets 196 Chol 170, TG 97, HDL 57, LDL 94 TSH 3.3 normal   10/2019: HbA1C 7.8%  Review of Systems  Cardiovascular: Positive for palpitations. Negative for chest pain, dyspnea on exertion, leg swelling and syncope.  Neurological: Positive for headaches.  Psychiatric/Behavioral: The patient is nervous/anxious.         Vitals:   07/10/20 1326  BP: (!) 143/78  Pulse: 76  Resp: 17  SpO2: 98%     Body mass index is 23.41 kg/m. Filed Weights   07/10/20 1326  Weight: 128 lb (58.1 kg)     Objective:   Physical Exam Vitals and nursing note reviewed.  Constitutional:      Appearance: She is well-developed.  Neck:     Vascular: No JVD.  Cardiovascular:     Rate and Rhythm: Normal rate and regular rhythm.     Pulses: Intact distal pulses.          Dorsalis pedis pulses are 2+ on the right side and 2+ on the left side.       Posterior tibial pulses are 2+ on the right side and 2+ on the left side.     Heart sounds: Normal heart sounds. No murmur heard.   Pulmonary:     Effort: Pulmonary effort is normal.     Breath sounds: Normal breath sounds. No wheezing or rales.         Assessment & Recommendations:   69 y.o. [name] with hypertension, type 2 diabetes mellitus, hyperlipidemia, solitary kidney, family history of brain aneurysm, with  elevated calcium score, symptomatic PVC's  Elevated calcium score: No angina symptoms. Recommend aggressive medial therapy.  Ok to reduce crestor to 20 mg. Repeat lipid panel with PCP in 6 months. Continue Aspirin 81 mg, which she takes couple times a week.  Continue hypertension control.   Bilateral calf pain: Will check ABI.  Symptomatic PVC's: Now improved on metoprolol tartarate.  Hypertension: Now controlled.  Type 2 DM: Continue management as per PCP.  F/u in 1 year  Nigel Mormon, MD [facility]  [place] Pager: [phone] Office: [phone]

## 2020-08-04 ENCOUNTER — Ambulatory Visit: Payer: Medicare HMO

## 2020-08-04 ENCOUNTER — Other Ambulatory Visit: Payer: Self-pay

## 2020-08-04 DIAGNOSIS — M79605 Pain in left leg: Secondary | ICD-10-CM

## 2020-08-19 ENCOUNTER — Other Ambulatory Visit: Payer: Self-pay | Admitting: Cardiology

## 2020-08-19 DIAGNOSIS — I10 Essential (primary) hypertension: Secondary | ICD-10-CM

## 2020-08-19 DIAGNOSIS — R002 Palpitations: Secondary | ICD-10-CM

## 2020-11-20 ENCOUNTER — Other Ambulatory Visit: Payer: Self-pay

## 2020-11-20 DIAGNOSIS — E782 Mixed hyperlipidemia: Secondary | ICD-10-CM

## 2020-11-20 MED ORDER — ROSUVASTATIN CALCIUM 20 MG PO TABS: 20.0000 mg | ORAL_TABLET | Freq: Every day | ORAL | 1 refills | Status: DC

## 2021-04-15 ENCOUNTER — Other Ambulatory Visit: Payer: Self-pay | Admitting: Cardiology

## 2021-04-15 DIAGNOSIS — E782 Mixed hyperlipidemia: Secondary | ICD-10-CM

## 2021-05-30 ENCOUNTER — Other Ambulatory Visit: Payer: Self-pay | Admitting: Cardiology

## 2021-05-30 DIAGNOSIS — R002 Palpitations: Secondary | ICD-10-CM

## 2021-05-30 DIAGNOSIS — I10 Essential (primary) hypertension: Secondary | ICD-10-CM

## 2021-07-09 ENCOUNTER — Ambulatory Visit: Payer: Medicare HMO | Admitting: Cardiology

## 2021-07-09 ENCOUNTER — Other Ambulatory Visit: Payer: Self-pay

## 2021-07-09 ENCOUNTER — Encounter: Payer: Self-pay | Admitting: Cardiology

## 2021-07-09 VITALS — BP 137/82 | HR 82 | Temp 98.4°F | Ht 62.0 in | Wt 124.8 lb

## 2021-07-09 DIAGNOSIS — E782 Mixed hyperlipidemia: Secondary | ICD-10-CM

## 2021-07-09 DIAGNOSIS — I493 Ventricular premature depolarization: Secondary | ICD-10-CM

## 2021-07-09 DIAGNOSIS — R931 Abnormal findings on diagnostic imaging of heart and coronary circulation: Secondary | ICD-10-CM

## 2021-07-09 DIAGNOSIS — I10 Essential (primary) hypertension: Secondary | ICD-10-CM

## 2021-07-09 DIAGNOSIS — I739 Peripheral vascular disease, unspecified: Secondary | ICD-10-CM

## 2021-07-09 NOTE — Progress Notes (Signed)
Patient referred by Berkley Harvey, NP for palpitations  Subjective:   Tonya Thornton, female    DOB: 06-10-1951, 70 y.o.   MRN: [id number]   Chief Complaint  Patient presents with   pvc's   Follow-up    1 year f/u      HPI  70 y.o. [name] with hypertension, type 2 diabetes mellitus, hyperlipidemia, solitary kidney, family history of brain aneurysm, palpitations.  Patient is doing well. She denies chest pain, shortness of breath, palpitations, leg edema, orthopnea, PND, TIA/syncope. She is trying to walk more. She has occasional leg swelling, bilateral calf pain at night. Blood pressure is well controlled. PVC's are much improved.    Initial consultation HPI 02/2020: She has had several complaints over the last year or so. This includes bilareral leg/calf pain, generalized fatigue, intolerance to cold, long standing headache, and now palpitations and elevated blood pressure. Headache and elevated blood pressure seem to correlate with inadequate sleep. She denies any chest pain or shortness of breath. Patient has uncontrolled hypertension and diabetes. There does seem to be variability to her blood pressure readings, worse after inadequate sleep at night. She was recently told to have PVS on EKG performed by her PCP.   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 Patient enjoys walking, walks up to 2 miles over an hour or so.  She does have family h/o CAD, with her brother having had a MI related cardiac arrest recently. She also has family h.o brain aneurysm, but reportedly underwent brain MRI herself which was negative for aneurysm. She has a lot of anxiety related to her symptoms and worries what would happen if she has similar episodes "while walking on the street".     Current Outpatient Medications on File Prior to Visit  Medication Sig Dispense Refill   Ascorbic Acid (VITAMIN C) 100 MG tablet Take 100 mg by mouth daily.     aspirin EC  81 MG tablet Take 81 mg by mouth daily.     Calcium Carbonate-Vitamin D (CALCIUM 600+D PO) Take 1 tablet by mouth daily.     Cholecalciferol 25 MCG (1000 UT) capsule Take 1 capsule by mouth daily.     diclofenac Sodium (VOLTAREN) 1 % GEL APPLY TO AFFECTED AREA 2 GRAMS 2 TO 3 TIMES DAILY AS NEEDED     lisinopril-hydrochlorothiazide (ZESTORETIC) 10-12.5 MG tablet Take 1 tablet by mouth daily.     MAGNESIUM CHLORIDE PO Take 1 tablet by mouth daily. Every 2 days     metFORMIN (GLUCOPHAGE-XR) 500 MG 24 hr tablet Take 3 tablets by mouth daily.     metoprolol tartrate (LOPRESSOR) 25 MG tablet TAKE 1 TABLET TWICE DAILY 180 tablet 3   Multiple Vitamin (MULTIVITAMIN) tablet Take 1 tablet by mouth daily.     rosuvastatin (CRESTOR) 20 MG tablet TAKE 1 TABLET EVERY DAY 90 tablet 1   No current facility-administered medications on file prior to visit.    Cardiovascular and other pertinent studies:  EKG 07/09/2021: Sinus rhythm 69 bpm  Nonspecific T wave inversion anteroseptal leads  ABI 08/04/2020:  This exam reveals normal perfusion of the right lower extremity (ABI 0.98)  and mildly decreased perfusion of the left lower extremity, noted at the  posterior and anterior tibial artery level (ABI 0.81). There is normal  triphasic waveform in all the vessels at the ankles except left PT,  biphasic waveform.  Real time outpatient cardiac telemetry 02/26/2020: Monitoring period 319 hr 58 min  Dominant rhythm: Sinus. HR 57-117 bpm. Avg HR 75 bpm. Ventricular ectopy 1.3%, with isolated VE and one episode of 6 beat NSVT Rare supraventricular ectopy No atrial fibrillation/atrial flutter/SVT/VT/high grade AV block, sinus pause >3sec noted. Symptoms reported: None   Echocardiogram 02/26/2020:    Normal LV systolic function with EF 61%. Left ventricle cavity is normal  in size. Normal left ventricular wall thickness. Normal global wall  motion. Normal diastolic filling pattern. Calculated EF 61%.   Trileaflet aortic valve.  Trace aortic regurgitation.  Structurally normal mitral valve.  Mild (Grade I) mitral regurgitation.  CT cardiac scoring 02/25/2020: Total score: 102 LM: 0 LAD: 86 LCx: 0 RCA: 16  EKG 02/19/2020: Sinus rhythm 89 bpm. Nonspecific T wave ivnersion.   Recent labs: 06/25/2021: Glucose 125, BUN/Cr 9/0.85. EGFR 74. Na/K 138/4.5. Rest of the CMP normal HbA1C 7.6%  02/13/2020: Glucose 136, BUN/Cr 11/0.74 EGFR normal. Na/K 136/4.5. Rest of the CMP normal H/H 12/38. MCV 84. Platelets 196 Chol 170, TG 97, HDL 57, LDL 94 TSH 3.3 normal   10/2019: HbA1C 7.8%  Review of Systems  Cardiovascular:  Positive for palpitations. Negative for chest pain, dyspnea on exertion, leg swelling and syncope.  Neurological:  Positive for headaches.  Psychiatric/Behavioral:  The patient is nervous/anxious.        Vitals:   07/09/21 1143  BP: 137/82  Pulse: 82  Temp: 98.4 F (36.9 C)  SpO2: 97%     Body mass index is 22.83 kg/m. Filed Weights   07/09/21 1143  Weight: 124 lb 12.8 oz (56.6 kg)     Objective:   Physical Exam Vitals and nursing note reviewed.  Constitutional:      Appearance: She is well-developed.  Neck:     Vascular: No JVD.  Cardiovascular:     Rate and Rhythm: Normal rate and regular rhythm.     Pulses: Intact distal pulses.          Dorsalis pedis pulses are 2+ on the right side and 2+ on the left side.       Posterior tibial pulses are 2+ on the right side and 2+ on the left side.     Heart sounds: Normal heart sounds. No murmur heard. Pulmonary:     Effort: Pulmonary effort is normal.     Breath sounds: Normal breath sounds. No wheezing or rales.        Assessment & Recommendations:   70 y.o. [name] with hypertension, type 2 diabetes mellitus, hyperlipidemia, solitary kidney, family history of brain aneurysm, with  elevated calcium score, symptomatic PVC's  Elevated calcium score: No angina symptoms. Recommend  aggressive medial therapy.  Currently on crestor to 20 mg. Check lipid panel. Continue hypertension control.   Bilateral calf pain: Left PT ABI 0.81 with biphasic waveform Bilateral calf pain unlikely to be due this mild PAD. Encourage walking.  Symptomatic PVC's: Now improved on metoprolol tartarate.  Hypertension: Now controlled.  Type 2 DM: A1C 7.6%. Continue management as per PCP.  F/u in 1 year  Nigel Mormon, MD [facility]. [place] Pager: [phone] Office: [phone]

## 2021-07-13 LAB — LIPID PANEL
Chol/HDL Ratio: 2.6 ratio (ref 0.0–4.4)
Cholesterol, Total: 151 mg/dL (ref 100–199)
HDL: 57 mg/dL (ref >39)
LDL Chol Calc (NIH): 79 mg/dL (ref 0–99)
Triglycerides: 80 mg/dL (ref 0–149)
VLDL Cholesterol Cal: 15 mg/dL (ref 5–40)

## 2021-10-21 ENCOUNTER — Ambulatory Visit: Payer: Medicare HMO | Admitting: Sports Medicine

## 2021-10-21 ENCOUNTER — Ambulatory Visit
Admission: RE | Admit: 2021-10-21 | Discharge: 2021-10-21 | Disposition: A | Discharge status: Home / Self Care | Payer: Medicare HMO | Source: Ambulatory Visit | Attending: Sports Medicine | Admitting: Sports Medicine

## 2021-10-21 ENCOUNTER — Ambulatory Visit: Payer: Self-pay

## 2021-10-21 VITALS — BP 134/82 | Ht 62.0 in | Wt 121.0 lb

## 2021-10-21 DIAGNOSIS — M542 Cervicalgia: Secondary | ICD-10-CM | POA: Diagnosis not present

## 2021-10-21 DIAGNOSIS — M25511 Pain in right shoulder: Secondary | ICD-10-CM

## 2021-10-21 NOTE — Assessment & Plan Note (Signed)
Likely some degenerative changes contributing.  Recommend voltaren gel and HEP per above.  Will also obtain C-spine films to assess extent of degenerative changes.

## 2021-10-21 NOTE — Progress Notes (Signed)
Tonya Thornton is a 70 y.o. female who presents to [facility] today for the following:  Right shoulder pain Chronic pain, has previously seen Dr. Ave Filter for this and had an MRI that was suggestive of a subscapularis tear as well as biceps tendon tear Had an ultrasound here in May 2021 that showed the same Reports that pain had been doing okay, then in December 2021 got worse Had some decreased strength as well that she noted when she was trying to carry things States that in April she was seen by her primary care provider and had a subacromial injection that helped for about 1-1/2 months Then it came back in July and she has been having significant pain since then Pain is mostly in the lateral aspect of her right arm Has noted some atrophy on the lateral aspect of her arm as well that was present prior to her last visit here on 05/26/2020  Neck pain Also reports that in November she went on a long flight to [place] and when she came back she had significant pain, stiffness, spasm in her neck No numbness and tingling No recent injuries States that this is improved and is not as severe, however she continues to have some pain and limited range of motion in her neck   PMH reviewed.  ROS as above. Medications reviewed.  Exam:  BP 134/82   Ht 5\' 2"  (1.575 m)   Wt 121 lb (54.9 kg)   BMI 22.13 kg/m  Gen: Well NAD MSK:  Right shoulder Inspection: There is some slight atrophy noted in the lateral aspect of her arm in the region of her biceps musculature, but there is no Popeye deformity noted on the right.  No bruising. No swelling Palpation is normal with no TTP over [facility] joint or bicipital groove b/l. Full ROM in flexion, abduction, external rotation to 50 degrees on the right and internal rotation to back pocket on the right.  There is full external and internal rotation on the left. NV intact distally b/l Normal scapular function observed b/l Special Tests:  - Impingement: Neg  Hawkins - Supraspinatous: Equivocal empty can - Infraspinatous/Teres Minor: 5/5 strength with ER - Subscapularis: 5/5 strength with IR with some compensation noted - Biceps tendon: Positive Speeds - Labrum: Negative Obriens, negative clunk, good stability - AC Joint: Negative cross arm - Negative apprehension test - No drop arm sign  Neck/Back: - Inspection: no gross deformity or asymmetry, swelling or ecchymosis - Palpation: No TTP  spinous process, There is no TTP of paraspinal musculature, but there is R>L hypertonicity in periscapular musculature - ROM: Slightly limited in all fields 2/2 pain, worse pain with right sidebending and rotation - Strength: 5/5 wrist flexion, extension, biceps flexion, triceps extension. OK sign, interosseus strength intact  - Neuro: sensation intact in the C5-C8 nerve root distribution b/l, - Special testing: negative spurling's  ULTRASOUND: Shoulder, Right  Diagnostic complete ultrasound imaging obtained of patient's right shoulder.  - No obvious evidence of bony deformity or osteophyte development appreciated.  - Long head of the biceps tendon: There is a partial thickness tear with notable prominent osteophyte of the humerus and some slight retraction of the long head of biceps tendon - Pec major insertion visualized without abnormality. - Subscapularis tendon: complete visualization across the width of the insertion point yielded previously noted tearing with some retraction and a seroma - Supraspinatus tendon: complete visualization across the width of the insertion point yielded small area of calcification but no  significant tearing in the long axis view. No evidence of bursal inflammation appreciated.  - Infraspinatus and teres minor tendons: visualization across the width of the insertion points yielded no evidence of tendon thickening, calcification, or tears in the long axis view.  - AC Joint: Visualized with osteophytic edges and moderate  effusion - Posterior Glenohumeral Joint visualized without abnormality. IMPRESSION: findings consistent with previously noted subscapularis tendon tear with retraction and partial long head of biceps tendon tear with some retraction. Asymptomatic AC arthropathy with effusion.   Ultrasound and interpretation by Sibyl Parr. Fields, MD and Luis Abed, DO    No results found.   Assessment and Plan: 1) Pain in joint, shoulder region Previously noted subscapularis tendon tear is retracted and likely causing biceps tendon to move out of groove and rub over the osteophyte noted.  She is very functional and has compensated well with strength.  We will give her topical voltaren gel given she has one kidney and curls, rolls, flys, and IR at 45 degrees, gentle neck and shoulder ROM.  F/U 6 weeks prn.  Neck pain Likely some degenerative changes contributing.  Recommend voltaren gel and HEP per above.  Will also obtain C-spine films to assess extent of degenerative changes.   Luis Abed, D.O.  PGY-4 [place] [facility]  10/21/2021 4:30 PM I observed and examined the patient with the [facility] resident and agree with assessment and plan.  Note reviewed and modified by me. Sterling Big, MD

## 2021-10-21 NOTE — Patient Instructions (Signed)
It was great to see you today!  -Start the exercises we gave you.  -Do some easy motion of your neck. -Get neck x-rays at your earliest convenience. -Try topical voltaren gel up to 4 times a day. -Follow up in 4-6 weeks.

## 2021-10-21 NOTE — Assessment & Plan Note (Signed)
Previously noted subscapularis tendon tear is retracted and likely causing biceps tendon to move out of groove and rub over the osteophyte noted.  She is very functional and has compensated well with strength.  We will give her topical voltaren gel given she has one kidney and curls, rolls, flys, and IR at 45 degrees, gentle neck and shoulder ROM.  F/U 6 weeks prn.

## 2021-11-03 ENCOUNTER — Telehealth (HOSPITAL_COMMUNITY): Payer: Self-pay | Admitting: Family Medicine

## 2021-11-03 NOTE — Telephone Encounter (Signed)
Called in regards to XR results. No answer.  Left VM without identifier that XR looks okay and no changes needed to plan.  Advised to call back if questions.

## 2021-11-14 ENCOUNTER — Other Ambulatory Visit: Payer: Self-pay | Admitting: Cardiology

## 2021-11-14 DIAGNOSIS — E782 Mixed hyperlipidemia: Secondary | ICD-10-CM

## 2021-11-25 ENCOUNTER — Ambulatory Visit: Payer: Medicare HMO | Admitting: Sports Medicine

## 2021-12-16 ENCOUNTER — Ambulatory Visit: Payer: Medicare HMO | Admitting: Sports Medicine

## 2021-12-16 VITALS — BP 140/82 | Ht 62.0 in | Wt 122.0 lb

## 2021-12-16 DIAGNOSIS — M542 Cervicalgia: Secondary | ICD-10-CM

## 2021-12-16 DIAGNOSIS — M25511 Pain in right shoulder: Secondary | ICD-10-CM | POA: Diagnosis not present

## 2021-12-16 NOTE — Progress Notes (Signed)
Tonya Thornton is a 71 y.o. female who presents to [facility] today for the following:  Right shoulder pain follow-up Last seen for the same on 12/8 Performed an ultrasound at that time that saw some tearing and retraction at subscapularis as well as partial tendon tear of the long head of the biceps tendon with some retraction Given home exercises and advised to use topical Voltaren gel Having improvement at this She has been doing her home exercises Still has some pain mostly in the anterior aspect of her shoulder when she stretches the biceps tendon She has been able to carry some weight in this arm which is improved, but she tries to stay less than 2 pounds so that she does not injure it further  Neck pain follow-up Also seen for the same on 12/8 Had an x-ray on 12/9 that showed mild degenerative disc disease and facet hypertrophy throughout Given gentle range of motion exercises for her neck Still having some pain on the right side of her neck that sometimes goes up near the occiput She denies any numbness and tingling She has not been using any Voltaren gel because she was concerned about only having 1 kidney Has been doing Silver sneakers and finds it this makes her feel good She is doing gentle stretching for her neck   PMH reviewed.  ROS as above. Medications reviewed.  Exam:  BP 140/82    Ht 5\' 2"  (1.575 m)    Wt 122 lb (55.3 kg)    BMI 22.31 kg/m  Gen: Well NAD MSK:  Right Shoulder: Inspection reveals no obvious deformity, atrophy, or asymmetry b/l. No bruising. No swelling Palpation is normal with no TTP over [facility] joint or bicipital groove b/l. Full ROM in flexion, abduction, internal/external rotation b/l NV intact distally b/l Special Tests:  - Impingement: Neg Hawkins, empty can sign. - Supraspinatous: Negative empty can - Infraspinatous/Teres Minor: 5/5 strength with ER - Subscapularis: 5/5 strength with IR - Biceps tendon: Positive Speeds - Labrum:  Negative Obriens - No painful arc and no drop arm sign   Neck/Back: - Inspection: no gross deformity or asymmetry, swelling or ecchymosis - Palpation: No TTP spinous process, of right paraspinal musculature into the upper trapezius on the right - ROM: full active ROM of the cervical spine with neck extension, rotation, flexion -pain with extension and left-sided rotation and sidebending - Strength: 5/5 wrist flexion, extension, biceps flexion, triceps extension. OK sign, interosseus strength intact  - Neuro: sensation intact in the C5-C8 nerve root distribution b/l - Special testing: negative spurling's  No results found.   Assessment and Plan: 1) Pain in joint of right shoulder Improving.  Continue home exercises.  Recommended Voltaren gel and reassured her that there is minimal systemic absorption of this will not affect her kidney.  Follow-up in 6 to 8 weeks as needed.  Neck pain This seems to be the most concerning pain for today.  She specifically asked if this could be causing damage to her brain, and we provided reassurance that this pain is not causing damage to her brain.  Recommended using Voltaren gel and provided the reassurance for this as above.  Given isometric cervical exercises as well as trialed in a soft collar to see if she can use this to rest her neck for 30 minutes to an hour or so while she is at home.  Also given exercises to lift her head straight up, hold 10 seconds for 5 sets.  Recommended doing this  2-3 times a day.  Follow-up in 8 weeks.   Luis Abed, D.O.  PGY-4 [place] [facility]  12/16/2021 2:52 PM  I observed and examined the patient with [facility] resident and agree with assessment and plan.  Note reviewed and modified by me. Sterling Big, MD

## 2021-12-16 NOTE — Assessment & Plan Note (Signed)
This seems to be the most concerning pain for today.  She specifically asked if this could be causing damage to her brain, and we provided reassurance that this pain is not causing damage to her brain.  Recommended using Voltaren gel and provided the reassurance for this as above.  Given isometric cervical exercises as well as trialed in a soft collar to see if she can use this to rest her neck for 30 minutes to an hour or so while she is at home.  Also given exercises to lift her head straight up, hold 10 seconds for 5 sets.  Recommended doing this 2-3 times a day.  Follow-up in 8 weeks.

## 2021-12-16 NOTE — Patient Instructions (Signed)
Thank you for coming to see me today. It was a pleasure. Today we talked about:   You can try to use a soft collar throughout the day to give your neck arrest.  You can also lift her head straight up and hold this for 10 seconds.  Do this 5 times.  Try to repeat this 2-3 times a day.  We have also given you strengthening exercises for your neck.  Add this to your exercises.  You can continue your home exercises for your shoulder.  Please follow-up with [place] in 8 weeks.  If you have any questions or concerns, please do not hesitate to call the office at [phone].  Best,   [place] Constable, DO [place]

## 2021-12-16 NOTE — Assessment & Plan Note (Addendum)
Improving.  Continue home exercises.  Recommended Voltaren gel and reassured her that there is minimal systemic absorption of this will not affect her kidney.  Follow-up in 6 to 8 weeks as needed.

## 2022-02-10 ENCOUNTER — Ambulatory Visit: Payer: Medicare HMO | Admitting: Sports Medicine

## 2022-02-10 DIAGNOSIS — M25511 Pain in right shoulder: Secondary | ICD-10-CM | POA: Diagnosis not present

## 2022-02-10 DIAGNOSIS — M25551 Pain in right hip: Secondary | ICD-10-CM | POA: Diagnosis not present

## 2022-02-10 DIAGNOSIS — M1711 Unilateral primary osteoarthritis, right knee: Secondary | ICD-10-CM

## 2022-02-10 NOTE — Progress Notes (Signed)
Patient was instructed in 10 minutes of therapeutic exercises for right leg weakness to improve strength, ROM and function according to my instructions and plan of care by a Certified Athletic Trainer during the office visit.  Proper technique shown and discussed, handout provided.  All questions discussed and answered.  ?

## 2022-02-10 NOTE — Progress Notes (Signed)
CC; F/u of RT shoulder and neck ?RT knee pain ? ?Patient has been doing some silver sneakers ?With HEP and some therapy the neck, posture and muscle tightness are improved ?In her RT shoulder she had a partial biceps and small subscap tear on [place] ?Still some slight pain but does HEP with flexion and rotation and that has helped ? ?She is able to use RT arm but does not want to lift much ? ?Has some lateral hip and RT knee pain ?Previously had degenerative med. Meniscus on RT treated conservatively ?Now walks better and more ?Worred about rt knee bowing ? ?ROS ?No locking, swelling or giving way ? ?PE ?Elderly [place] F in NAD ?BP 122/78   Ht 5\' 2"  (1.575 m)   Wt 124 lb (56.2 kg)   BMI 22.68 kg/m?  ? ?Neck and head position improved but still tends to head forward ?Less spasm over trapezius on RT ?Shoulder motion is good on RT and moderate strength to all tests ? ?Weakness on hip abduction is on RT only ? ?Slight varus change to RT knee ?Gait is supinated on RT ?Standing position shoes supination and IR of RT foot ?

## 2022-02-10 NOTE — Assessment & Plan Note (Signed)
She has [place] [facility] tears in biceps and subscap based on prior US ?Keep her with flexion and ER/IR exercises ?Work on posture ? ?We will reassess this in 6 to 12 wks ?

## 2022-02-10 NOTE — Assessment & Plan Note (Signed)
We added a wedge with lateral lift to lessen the varus shift of RT knee ?Trial with this for 1 month to see if this helps ?

## 2022-03-28 ENCOUNTER — Other Ambulatory Visit (HOSPITAL_BASED_OUTPATIENT_CLINIC_OR_DEPARTMENT_OTHER): Payer: Self-pay

## 2022-04-22 ENCOUNTER — Other Ambulatory Visit: Payer: Self-pay | Admitting: Gastroenterology

## 2022-04-22 DIAGNOSIS — R1310 Dysphagia, unspecified: Secondary | ICD-10-CM

## 2022-04-27 ENCOUNTER — Ambulatory Visit
Admission: RE | Admit: 2022-04-27 | Discharge: 2022-04-27 | Disposition: A | Discharge status: Home / Self Care | Payer: Medicare HMO | Source: Ambulatory Visit | Attending: Gastroenterology | Admitting: Gastroenterology

## 2022-04-27 DIAGNOSIS — R1310 Dysphagia, unspecified: Secondary | ICD-10-CM

## 2022-05-24 ENCOUNTER — Ambulatory Visit: Payer: Medicare HMO | Admitting: Sports Medicine

## 2022-05-24 ENCOUNTER — Ambulatory Visit
Admission: RE | Admit: 2022-05-24 | Discharge: 2022-05-24 | Disposition: A | Discharge status: Home / Self Care | Payer: Medicare HMO | Source: Ambulatory Visit | Attending: Sports Medicine | Admitting: Sports Medicine

## 2022-05-24 VITALS — BP 133/76 | Ht 62.0 in | Wt 124.0 lb

## 2022-05-24 DIAGNOSIS — M25532 Pain in left wrist: Secondary | ICD-10-CM | POA: Diagnosis not present

## 2022-05-24 DIAGNOSIS — M25562 Pain in left knee: Secondary | ICD-10-CM

## 2022-05-24 DIAGNOSIS — R269 Unspecified abnormalities of gait and mobility: Secondary | ICD-10-CM

## 2022-05-24 DIAGNOSIS — G8929 Other chronic pain: Secondary | ICD-10-CM | POA: Diagnosis not present

## 2022-05-24 DIAGNOSIS — M1711 Unilateral primary osteoarthritis, right knee: Secondary | ICD-10-CM | POA: Diagnosis not present

## 2022-05-24 MED ORDER — METHYLPREDNISOLONE ACETATE 40 MG/ML IJ SUSP
40.0000 mg | Freq: Once | INTRAMUSCULAR | Status: AC
Start: 2022-05-24 — End: 2022-05-24
  Administered 2022-05-24: 40 mg via INTRA_ARTICULAR

## 2022-05-24 NOTE — Progress Notes (Addendum)
Subjective:    Patient ID: Tonya Thornton, female    DOB: Oct 23, 1951, 71 y.o.   MRN: [id number]  HPI chief complaint: Bilateral knee pain and left wrist pain  Patient is a very pleasant 71 year old female that comes in today with a couple of different complaints.  Main complaint is bilateral knee pain, left greater than right.  She has a history of a meniscus tear in the right knee which healed with conservative treatment a couple of years ago.  Her left knee most recently started to hurt diffusely without any known trauma.  She has not noticed any swelling.  No mechanical symptoms.  She is also now beginning to experience returning pain in the right knee.  Pain is primarily along the medial joint line on the right.  No radiating pain.  She is also complaining of some ulnar-sided left wrist pain.  Approximately 10 years ago she had surgery on the [facility] joint on this same hand.  Pain in the left wrist is most noticeable with lifting heavy objects.  No trauma.  She is also noting a worsening supination of her right foot.  She was previously given a lateral heel wedge for this which has been slightly helpful.  She is also worried about some pain that she is getting along the lateral aspect of her fifth metatarsal head on the right and she has a bunion on the left foot which is also painful.    Review of Systems As above    Objective:   Physical Exam  Well-developed, well-nourished.  No acute distress  Left wrist: Limited active flexion and extension.  Well-healed surgical incision consistent with her previous [facility] surgery.  She has a small palpable ganglion cyst along the ulnar aspect of her wrist.  It is nonpulsatile.  Nontender to palpation.  Right hip: Smooth painless hip range of motion with a negative logroll  Examination of both knees shows good range of motion.  Trace effusion on the left.  No tenderness to palpation.  Knees are grossly stable to ligamentous exam.  Negative  Thessaly's bilaterally.  Trace patellofemoral crepitus.  Neurovascularly intact distally.  Examination of her gait with her shoes off does show significant supination on the right.  This does correct with an wearing her lateral heel wedge in her shoes.  Also noted on exam is transverse arch collapse bilaterally with widening of the toes, left greater than right.  Also a moderate bunion deformity on the left.        Assessment & Plan:   Bilateral knee pain, left greater than right, secondary to DJD Ganglion cyst, left wrist Right foot supination Bilateral transverse arch collapse  Patient's more symptomatic left knee was injected today with cortisone.  This done atraumatically under sterile technique utilizing an anterior medial approach.  She tolerates this without difficulty.  I had a long talk with her about the importance of hip and quadricep strengthening.  It sounds like she has a pretty good understanding of these exercises but she is quite weak with her sit to stand test.  She will be more diligent about working on strengthening and will follow-up with me again in 4 weeks for reevaluation.  She will also use over-the-counter Voltaren twice daily.  Prior to her follow-up visit with me she will get standing x-rays of her knees so I can evaluate the degree of osteoarthritis present.  For her left wrist we have recommended a body helix compression sleeve.  For her feet, we have  added metatarsal pads to help support her transverse arches in addition to the lateral heel wedge on the right.  This note was dictated using Dragon naturally speaking software and may contain errors in syntax, spelling, or content which have not been identified prior to signing this note.   Consent obtained and verified. Time-out conducted. Noted no overlying erythema, induration, or other signs of local infection. Skin prepped in a sterile fashion. Topical analgesic spray: Ethyl chloride. Joint: Left  knee Needle: 25-gauge 1.5 inch Completed without difficulty. Meds: 3 cc 1% Xylocaine, 1 cc (40 mg) Depo-Medrol  Advised to call if fevers/chills, erythema, induration, drainage, or persistent bleeding.   Addendum: X-rays reviewed.  There is moderately advanced medial compartmental DJD in both knees.  I will discuss this with the patient at her follow-up visit in 4 weeks.

## 2022-05-26 ENCOUNTER — Ambulatory Visit: Payer: Medicare HMO | Admitting: Sports Medicine

## 2022-06-22 ENCOUNTER — Ambulatory Visit: Payer: Medicare HMO | Admitting: Cardiology

## 2022-06-22 ENCOUNTER — Other Ambulatory Visit: Payer: Self-pay | Admitting: Cardiology

## 2022-06-22 ENCOUNTER — Encounter: Payer: Self-pay | Admitting: Cardiology

## 2022-06-22 VITALS — BP 133/78 | HR 72 | Temp 98.0°F | Resp 16 | Ht 62.0 in | Wt 121.0 lb

## 2022-06-22 DIAGNOSIS — M791 Myalgia, unspecified site: Secondary | ICD-10-CM

## 2022-06-22 DIAGNOSIS — I10 Essential (primary) hypertension: Secondary | ICD-10-CM

## 2022-06-22 DIAGNOSIS — E782 Mixed hyperlipidemia: Secondary | ICD-10-CM

## 2022-06-22 DIAGNOSIS — R931 Abnormal findings on diagnostic imaging of heart and coronary circulation: Secondary | ICD-10-CM

## 2022-06-22 DIAGNOSIS — R002 Palpitations: Secondary | ICD-10-CM

## 2022-06-22 NOTE — Progress Notes (Signed)
Patient referred by Berkley Harvey, NP for palpitations  Subjective:   Tonya Thornton, female    DOB: Mar 04, 1951, 71 y.o.   MRN: [id number]   Chief Complaint  Patient presents with   Hypertension   Hyperlipidemia   Follow-up    1 year   Results    Ct score     HPI  71 y.o. [name] with hypertension, type 2 diabetes mellitus, hyperlipidemia, solitary kidney, family history of brain aneurysm, palpitations.  Patient is doing well.  She has only occasional palpitations.  Separately, she has occasional symptoms of, what she calls as a weakness in her feet, and occasional pain on the right side of her head.  She is increasing her physical activity, denies any chest pain.    Reviewed recent test results with the patient, details below.   Initial consultation HPI 02/2020: She has had several complaints over the last year or so. This includes bilareral leg/calf pain, generalized fatigue, intolerance to cold, long standing headache, and now palpitations and elevated blood pressure. Headache and elevated blood pressure seem to correlate with inadequate sleep. She denies any chest pain or shortness of breath. Patient has uncontrolled hypertension and diabetes. There does seem to be variability to her blood pressure readings, worse after inadequate sleep at night. She was recently told to have PVS on EKG performed by her PCP.   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 Patient enjoys walking, walks up to 2 miles over an hour or so.  She does have family h/o CAD, with her brother having had a MI related cardiac arrest recently. She also has family h.o brain aneurysm, but reportedly underwent brain MRI herself which was negative for aneurysm. She has a lot of anxiety related to her symptoms and worries what would happen if she has similar episodes "while walking on the street".     Current Outpatient Medications:    Ascorbic Acid (VITAMIN C) 100 MG  tablet, Take 100 mg by mouth daily., Disp: , Rfl:    Cholecalciferol 25 MCG (1000 UT) capsule, Take 1 capsule by mouth daily., Disp: , Rfl:    diclofenac Sodium (VOLTAREN) 1 % GEL, APPLY TO AFFECTED AREA 2 GRAMS 2 TO 3 TIMES DAILY AS NEEDED, Disp: , Rfl:    lisinopril-hydrochlorothiazide (ZESTORETIC) 10-12.5 MG tablet, Take 1 tablet by mouth daily., Disp: , Rfl:    MAGNESIUM CHLORIDE PO, Take 1 tablet by mouth daily. Every 2 days, Disp: , Rfl:    metFORMIN (GLUCOPHAGE-XR) 500 MG 24 hr tablet, Take 3 tablets by mouth daily., Disp: , Rfl:    metoprolol tartrate (LOPRESSOR) 25 MG tablet, TAKE 1 TABLET TWICE DAILY, Disp: 180 tablet, Rfl: 3   Multiple Vitamin (MULTIVITAMIN) tablet, Take 1 tablet by mouth daily., Disp: , Rfl:    rosuvastatin (CRESTOR) 20 MG tablet, TAKE 1 TABLET EVERY DAY, Disp: 90 tablet, Rfl: 1  Cardiovascular and other pertinent studies:  EKG 06/22/2022: Sinus rhythm 74 bpm Anteroswptal T wave inversio, consider ischemia  Low voltage No change compared to previous EKG  ABI 08/04/2020:  This exam reveals normal perfusion of the right lower extremity (ABI 0.98)  and mildly decreased perfusion of the left lower extremity, noted at the  posterior and anterior tibial artery level (ABI 0.81). There is normal  triphasic waveform in all the vessels at the ankles except left PT,  biphasic waveform.  Real time outpatient cardiac telemetry 02/26/2020: Monitoring period 319 hr 58 min Dominant rhythm:  Sinus. HR 57-117 bpm. Avg HR 75 bpm. Ventricular ectopy 1.3%, with isolated VE and one episode of 6 beat NSVT Rare supraventricular ectopy No atrial fibrillation/atrial flutter/SVT/VT/high grade AV block, sinus pause >3sec noted. Symptoms reported: None   Echocardiogram 02/26/2020:    Normal LV systolic function with EF 61%. Left ventricle cavity is normal  in size. Normal left ventricular wall thickness. Normal global wall  motion. Normal diastolic filling pattern. Calculated EF 61%.   Trileaflet aortic valve.  Trace aortic regurgitation.  Structurally normal mitral valve.  Mild (Grade I) mitral regurgitation.  CT cardiac scoring 02/25/2020: Total score: 102 LM: 0 LAD: 86 LCx: 0 RCA: 16  EKG 02/19/2020: Sinus rhythm 89 bpm. Nonspecific T wave ivnersion.   Recent labs: 03/22/2022: Glucose 120, BUN/Cr 17/0.8. EGFR 79. Na/K 136/4.4. Rest of the CMP normal HbA1C 7.6%  09/2021: H/H 12/37. MCV 83. Platelets 213 Chol 166, TG 95, HDL 56, LDL 91 HbA1C 8.1%  02/13/2020: Chol 170, TG 97, HDL 57, LDL 94   10/2019: HbA1C 7.8%  Review of Systems  Cardiovascular:  Positive for palpitations (occasional). Negative for chest pain, dyspnea on exertion, leg swelling and syncope.  Neurological:  Positive for headaches.  Psychiatric/Behavioral:  The patient is nervous/anxious.         Vitals:   06/22/22 1130  BP: 133/78  Pulse: 72  Resp: 16  Temp: 98 F (36.7 C)  SpO2: 98%     Body mass index is 22.13 kg/m. Filed Weights   06/22/22 1130  Weight: 121 lb (54.9 kg)     Objective:   Physical Exam Vitals and nursing note reviewed.  Constitutional:      Appearance: She is well-developed.  Neck:     Vascular: No JVD.  Cardiovascular:     Rate and Rhythm: Normal rate and regular rhythm.     Pulses: Intact distal pulses.          Dorsalis pedis pulses are 2+ on the right side and 2+ on the left side.       Posterior tibial pulses are 2+ on the right side and 2+ on the left side.     Heart sounds: Normal heart sounds. No murmur heard. Pulmonary:     Effort: Pulmonary effort is normal.     Breath sounds: Normal breath sounds. No wheezing or rales.  Musculoskeletal:     Right lower leg: No edema.     Left lower leg: No edema.  Skin:    Comments: Prominent varicocities         Assessment & Recommendations:   71 y.o. [name] with hypertension, type 2 diabetes mellitus, hyperlipidemia, solitary kidney, family history of brain aneurysm,  with  elevated calcium score, symptomatic PVC's  Elevated calcium score: No angina symptoms. Recommend aggressive medial therapy.  Currently on crestor to 20 mg.  With her elevated chronic calcium score, I recommend LDL goal <70. Recently, she has had some nonspecific muscle weakness in her feet.  I will check CK.  If normal, increase Crestor to 40 mg daily and repeat lipid panel in 3 months.  Bilateral calf pain: Left PT ABI 0.81 with biphasic waveform Bilateral calf pain unlikely to be due this mild PAD. I suspect some of her pain could be due to venous insufficiency.  Recommend compression stockings and follow-up with vein clinic, as she may have had some recurrence of venous insufficiency after prior history of ablation.  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  Hypertension: Well controlled.  Type 2 DM: A1C 7.6%. Continue management as per PCP.  F/u in 3 months  [place], MD [facility] Pager: [phone] Office: [phone]

## 2022-07-26 ENCOUNTER — Ambulatory Visit: Payer: Medicare HMO | Admitting: Sports Medicine

## 2022-07-26 VITALS — BP 112/66 | Ht 62.0 in

## 2022-07-26 DIAGNOSIS — M542 Cervicalgia: Secondary | ICD-10-CM | POA: Diagnosis not present

## 2022-07-26 NOTE — Progress Notes (Signed)
PCP: Iona Hansen, NP  Subjective:   HPI: Patient is a 71 y.o. female here for neck pain.  Patient reports right sided neck pain over the past 10 months. Has been particularly bothersome over the last ~2 weeks. Pain is constant but worse when she first wakes up in the morning. She also describes occasional spasms of the neck muscles without any specific trigger. She has tried gentle ROM activities, heat, and voltaren gel with slight relief. Tries to avoid NSAIDs as she has a solitary kidney but took a few tablets when the pain was severe and this was helpful. No numbness, tingling, or weakness in her arms.   Also here for knee pain follow up. Was seen 05/24/22 for bilateral knee pain, L worse than R. Thought to be secondary to DJD. Had cortisone injection in L knee at that time and was recommended to do home quad/hip strengthening exercises. She reports her knee pain is significantly improved and is not a concern today.  Past Medical History:  Diagnosis Date   Chronic kidney disease    Diabetes mellitus without complication (HCC)    Hyperlipidemia    Hypertension     Current Outpatient Medications on File Prior to Visit  Medication Sig Dispense Refill   Ascorbic Acid (VITAMIN C) 100 MG tablet Take 100 mg by mouth daily.     Calcium Carbonate (CALCIUM 600 PO) Take by mouth.     Cholecalciferol 25 MCG (1000 UT) capsule Take 1 capsule by mouth daily.     diclofenac Sodium (VOLTAREN) 1 % GEL APPLY TO AFFECTED AREA 2 GRAMS 2 TO 3 TIMES DAILY AS NEEDED     Flaxseed, Linseed, (FLAXSEED OIL) 1000 MG CAPS Take by mouth.     folic acid (FOLVITE) 400 MCG tablet Take 400 mcg by mouth daily.     lisinopril-hydrochlorothiazide (ZESTORETIC) 10-12.5 MG tablet Take 1 tablet by mouth daily.     magnesium 30 MG tablet Take 30 mg by mouth 2 (two) times daily.     MAGNESIUM CHLORIDE PO Take 1 tablet by mouth daily. Every 2 days     metFORMIN (GLUCOPHAGE-XR) 500 MG 24 hr tablet Take 3 tablets by mouth  daily.     metoprolol tartrate (LOPRESSOR) 25 MG tablet TAKE 1 TABLET TWICE DAILY 180 tablet 3   Multiple Vitamin (MULTIVITAMIN) tablet Take 1 tablet by mouth daily.     Omega-3 1000 MG CAPS Take by mouth.     rosuvastatin (CRESTOR) 20 MG tablet TAKE 1 TABLET EVERY DAY 90 tablet 1   No current facility-administered medications on file prior to visit.    Past Surgical History:  Procedure Laterality Date   DG THUMB LEFT HAND      Allergies  Allergen Reactions   Onion Other (See Comments)    Raw onions Raw onions    Penicillins Other (See Comments)    There were no vitals taken for this visit.     02/10/2022   10:39 AM 05/24/2022    2:01 PM  [facility] Exercise  Frequency of aerobic exercise (# of days/week) 5 4  Average time in minutes 55 55  Frequency of strengthening activities (# of days/week) 3 3        No data to display              Objective:  Physical Exam:  Gen: NAD, comfortable in exam room Neck: mild cervical kyphosis noted. Inspection otherwise unremarkable. No midline tenderness to palpation. Mild TTP  of right paraspinal musculature, mostly around the level of C3. Slightly limited ROM with lateral bending. Has fairly good ROM with flexion, extension, and rotation although this elicits some discomfort. NVI distally. Shoulders: No TTP. FROM, 5/5 strength with abduction, flexion, internal/external rotation, no painful arc, negative empty can   Assessment & Plan:  1. Right Neck Pain: likely facet mediated pain secondary to DJD, as she had x-ray December 2022 showing multilevel facet hypertrophy. Disc spaces preserved on imaging and she has no radicular symptoms on history. Discussed treatment options including physical therapy vs MRI followed by facet injections. She wishes to pursue both options. Continue heat, voltaren gel, occasional NSAIDs in the meantime. Follow up after MRI imaging.  Maury Dus, MD PGY-3, [facility]  Patient seen and evaluated with the resident.  I agree with the above plan of care.  Patient would like to start some physical therapy and is also contemplating cervical facet injections.  Therefore, we will order a preprocedural MRI and she will follow-up with me in the office after the study to discuss those results and schedule the injections.

## 2022-07-28 ENCOUNTER — Ambulatory Visit: Payer: Medicare HMO

## 2022-07-28 DIAGNOSIS — I10 Essential (primary) hypertension: Secondary | ICD-10-CM

## 2022-08-03 ENCOUNTER — Ambulatory Visit: Payer: Medicare HMO

## 2022-08-08 ENCOUNTER — Ambulatory Visit
Admission: RE | Admit: 2022-08-08 | Discharge: 2022-08-08 | Disposition: A | Discharge status: Home / Self Care | Payer: Medicare HMO | Source: Ambulatory Visit | Attending: Sports Medicine | Admitting: Sports Medicine

## 2022-08-08 DIAGNOSIS — M542 Cervicalgia: Secondary | ICD-10-CM

## 2022-08-15 ENCOUNTER — Encounter: Payer: Self-pay | Admitting: Sports Medicine

## 2022-09-23 ENCOUNTER — Ambulatory Visit: Payer: Medicare HMO | Admitting: Cardiology

## 2022-09-23 ENCOUNTER — Encounter: Payer: Self-pay | Admitting: Cardiology

## 2022-09-23 VITALS — BP 140/80 | HR 75 | Ht 62.0 in | Wt 121.8 lb

## 2022-09-23 DIAGNOSIS — E782 Mixed hyperlipidemia: Secondary | ICD-10-CM

## 2022-09-23 DIAGNOSIS — I10 Essential (primary) hypertension: Secondary | ICD-10-CM

## 2022-09-23 MED ORDER — ROSUVASTATIN CALCIUM 40 MG PO TABS: 40.0000 mg | ORAL_TABLET | Freq: Every day | ORAL | 3 refills | Status: AC

## 2022-09-23 NOTE — Progress Notes (Signed)
Patient referred by Berkley Harvey, NP for palpitations  Subjective:   Tonya Thornton, female    DOB: 10/23/1951, 71 y.o.   MRN: [id number]   Chief Complaint  Patient presents with   Hypertension   Follow-up     HPI  71 y.o. Tonya Thornton female with hypertension, type 2 diabetes mellitus, hyperlipidemia, solitary kidney, family history of brain aneurysm, palpitations.  Patient had been doing well. Recently, she has had lack of sleep. BP has been elevated. While in the office today, blood pressure increased further, patient felt uneasy, episode lasted for about 20 min. BP eventually improved after taking additional dose of metoprolol. She has been tolerating Crestor 2 tabs of 20 mg daily well.    Initial consultation HPI 02/2020: She has had several complaints over the last year or so. This includes bilareral leg/calf pain, generalized fatigue, intolerance to cold, long standing headache, and now palpitations and elevated blood pressure. Headache and elevated blood pressure seem to correlate with inadequate sleep. She denies any chest pain or shortness of breath. Patient has uncontrolled hypertension and diabetes. There does seem to be variability to her blood pressure readings, worse after inadequate sleep at night. She was recently told to have PVS on EKG performed by her PCP.   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 Patient enjoys walking, walks up to 2 miles over an hour or so.  She does have family h/o CAD, with her brother having had a MI related cardiac arrest recently. She also has family h.o brain aneurysm, but reportedly underwent brain MRI herself which was negative for aneurysm. She has a lot of anxiety related to her symptoms and worries what would happen if she has similar episodes "while walking on the street".     Current Outpatient Medications:    Calcium Carbonate (CALCIUM 600 PO), Take by mouth., Disp: , Rfl:    Cholecalciferol 25 MCG  (1000 UT) capsule, Take 1 capsule by mouth daily., Disp: , Rfl:    diclofenac Sodium (VOLTAREN) 1 % GEL, APPLY TO AFFECTED AREA 2 GRAMS 2 TO 3 TIMES DAILY AS NEEDED, Disp: , Rfl:    lisinopril-hydrochlorothiazide (ZESTORETIC) 10-12.5 MG tablet, Take 1 tablet by mouth daily., Disp: , Rfl:    MAGNESIUM CHLORIDE PO, Take 1 tablet by mouth daily. Every 2 days, Disp: , Rfl:    metFORMIN (GLUCOPHAGE-XR) 500 MG 24 hr tablet, Take 4 tablets by mouth daily., Disp: , Rfl:    metoprolol tartrate (LOPRESSOR) 25 MG tablet, TAKE 1 TABLET TWICE DAILY, Disp: 180 tablet, Rfl: 3   Multiple Vitamins-Minerals (MULTIVITAMIN WITH MINERALS) tablet, Take 1 tablet by mouth daily., Disp: , Rfl:    rosuvastatin (CRESTOR) 20 MG tablet, TAKE 1 TABLET EVERY DAY (Patient taking differently: Take 40 mg by mouth daily.), Disp: 90 tablet, Rfl: 1  Cardiovascular and other pertinent studies:  EKG 06/22/2022: Sinus rhythm 74 bpm Anteroswptal T wave inversio, consider ischemia  Low voltage No change compared to previous EKG  ABI 08/04/2020:  This exam reveals normal perfusion of the right lower extremity (ABI 0.98)  and mildly decreased perfusion of the left lower extremity, noted at the  posterior and anterior tibial artery level (ABI 0.81). There is normal  triphasic waveform in all the vessels at the ankles except left PT,  biphasic waveform.  Real time outpatient cardiac telemetry 02/26/2020: Monitoring period 319 hr 58 min Dominant rhythm: Sinus. HR 57-117 bpm. Avg HR 75 bpm. Ventricular ectopy 1.3%, with isolated  VE and one episode of 6 beat NSVT Rare supraventricular ectopy No atrial fibrillation/atrial flutter/SVT/VT/high grade AV block, sinus pause >3sec noted. Symptoms reported: None   Echocardiogram 02/26/2020:    Normal LV systolic function with EF 61%. Left ventricle cavity is normal  in size. Normal left ventricular wall thickness. Normal global wall  motion. Normal diastolic filling pattern. Calculated EF  61%.  Trileaflet aortic valve.  Trace aortic regurgitation.  Structurally normal mitral valve.  Mild (Grade I) mitral regurgitation.  CT cardiac scoring 02/25/2020: Total score: 102 LM: 0 LAD: 86 LCx: 0 RCA: 16   Recent labs: 06/28/2022: Glucose 128, BUN/Cr 11/0.65. EGFR >90. Na/K 137/4.6. Rest of the CMP normal H/H 12/36. MCV 86. Platelets 194 HbA1C 7.3% Ferritin 13 (20-200) Chol 171, TG 68, HDL 60, LDL 97  03/22/2022: Glucose 120, BUN/Cr 17/0.8. EGFR 79. Na/K 136/4.4. Rest of the CMP normal HbA1C 7.6%  09/2021: H/H 12/37. MCV 83. Platelets 213 Chol 166, TG 95, HDL 56, LDL 91 HbA1C 8.1%  02/13/2020: Chol 170, TG 97, HDL 57, LDL 94   10/2019: HbA1C 7.8%  Review of Systems  Cardiovascular:  Negative for chest pain, dyspnea on exertion, leg swelling, palpitations and syncope.  Neurological:  Positive for headaches.  Psychiatric/Behavioral:  The patient is nervous/anxious.         Vitals:   09/23/22 1009 09/23/22 1016  BP: (!) 152/77 (!) 151/87  Pulse: 83 68  SpO2: 97%      Body mass index is 22.28 kg/m. Filed Weights   09/23/22 1009  Weight: 121 lb 12.8 oz (55.2 kg)     Objective:   Physical Exam Vitals and nursing note reviewed.  Constitutional:      Appearance: She is well-developed.  Neck:     Vascular: No JVD.  Cardiovascular:     Rate and Rhythm: Normal rate and regular rhythm.     Pulses: Intact distal pulses.          Dorsalis pedis pulses are 2+ on the right side and 2+ on the left side.       Posterior tibial pulses are 2+ on the right side and 2+ on the left side.     Heart sounds: Normal heart sounds. No murmur heard. Pulmonary:     Effort: Pulmonary effort is normal.     Breath sounds: Normal breath sounds. No wheezing or rales.  Musculoskeletal:     Right lower leg: Edema (1+) present.     Left lower leg: Edema (1+) present.  Skin:    Comments: Prominent varicocities         Assessment & Recommendations:   71 y.o. [name] female with hypertension, type 2 diabetes mellitus, hyperlipidemia, solitary kidney, family history of brain aneurysm, with  elevated calcium score, symptomatic PVC's  Elevated calcium score: No angina symptoms. Recommend aggressive medial therapy.  With her elevated chronic calcium score, I recommend LDL goal <70. Currently on crestor to 40 mg daily. Has upcoming labs with PCP.    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  Leg edema: Likely combination of uncontrolled blood pressure., recent dietary indiscretion with salt intake, as well as varicose veins.  Hypertension: Intermittent spikes. Will check renin/aldosterone, metanephrines. Okay to take metoprolol tartrate additional dose if SBP >160 mmHg. Has f/u w/PCP in 2 weeks. If BP remains elevated, increase lisinopril-HCTZ to 20-25 mg daily.  Type 2 DM: A1C 7.6%. Continue management as per PCP.  F/u in 3 months    Crissa Sowder Esther Hardy, MD  Pager: [phone] Office: [phone]

## 2022-09-30 ENCOUNTER — Telehealth: Payer: Self-pay

## 2022-09-30 NOTE — Telephone Encounter (Signed)
Initial follow-up after starting BP monitoring at home with RPM. Patient reports no further hypertensive episodes. BP well controlled.  Average Systolic BP Level 115.33 mmHg Lowest Systolic BP Level 104 mmHg Highest Systolic BP Level 164 mmHg  09/30/2022 Friday at 09:13 AM 113 / 78      09/29/2022 Thursday at 09:17 AM 115 / 75      09/28/2022 Wednesday at 10:48 AM 117 / 80      09/27/2022 Tuesday at 08:56 AM 105 / 74      09/27/2022 Tuesday at 08:48 AM 104 / 72      09/26/2022 Monday at 08:45 AM 111 / 76      09/26/2022 Monday at 08:38 AM 111 / 77      09/25/2022 Sunday at 08:35 AM 109 / 72      09/25/2022 Sunday at 08:29 AM 107 / 73      09/24/2022 Saturday at 07:56 AM 106 / 72      09/24/2022 Saturday at 07:38 AM 122 / 75      11 /08/2022 Friday at 11:38 AM 164 / 109

## 2022-10-03 NOTE — Telephone Encounter (Signed)
Looks good.  Thanks MJP

## 2022-10-04 ENCOUNTER — Ambulatory Visit: Payer: Medicare HMO | Admitting: Sports Medicine

## 2022-10-04 VITALS — BP 122/74 | Ht 62.0 in | Wt 122.0 lb

## 2022-10-04 DIAGNOSIS — G8929 Other chronic pain: Secondary | ICD-10-CM

## 2022-10-04 DIAGNOSIS — M25562 Pain in left knee: Secondary | ICD-10-CM | POA: Diagnosis not present

## 2022-10-04 MED ORDER — METHYLPREDNISOLONE ACETATE 40 MG/ML IJ SUSP
40.0000 mg | Freq: Once | INTRAMUSCULAR | Status: AC
  Administered 2022-10-04: 40 mg via INTRA_ARTICULAR

## 2022-10-04 NOTE — Progress Notes (Signed)
   Subjective:    Patient ID: Tonya Thornton, female    DOB: 1951-08-29, 71 y.o.   MRN: [id number]  HPI chief complaint: Left knee pain  Patient presents today with returning left knee pain.  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  Cortisone injection administered in July was helpful up until recently.  No recent trauma.    Review of Systems As above     Objective:   Physical Exam  Well-developed, well-nourished.  No acute distress  Left knee: Patient has about a 2 to 3 degree extension lag.  Flexion is full.  No obvious effusion.  Tender to palpation along medial joint line.  Negative McMurray's.  Ligamentous exam is stable.  Neurovascularly intact distally.      Assessment & Plan:   Returning left knee pain secondary to DJD  Left knee is injected today with cortisone as below.  An anterior medial approach was utilized.  She tolerates this without difficulty.  If she does not find cortisone injections to be beneficial long-term then she may benefit from viscosupplementation.  Follow-up for ongoing or recalcitrant issues.  Consent obtained and verified. Time-out conducted. Noted no overlying erythema, induration, or other signs of local infection. Skin prepped in a sterile fashion. Topical analgesic spray: Ethyl chloride. Joint: Left knee Needle: 25-gauge 1.5 inch Completed without difficulty. Meds: 3 cc 1% Xylocaine, 1 cc (40 mg) Depo-Medrol  This note was dictated using Dragon naturally speaking software and may contain errors in syntax, spelling, or content which have not been identified prior to signing this note.

## 2022-10-04 NOTE — Progress Notes (Signed)
    SUBJECTIVE:    OBJECTIVE:    ASSESSMENT/PLAN:   

## 2022-11-03 ENCOUNTER — Other Ambulatory Visit: Payer: Self-pay

## 2022-11-03 NOTE — Progress Notes (Signed)
Patient self decreased rosuvastatin to 20mg  daily from 40mg  daily

## 2022-12-20 ENCOUNTER — Encounter: Payer: Self-pay | Admitting: Cardiology

## 2022-12-20 NOTE — Telephone Encounter (Signed)
From patient

## 2022-12-23 ENCOUNTER — Encounter: Payer: Self-pay | Admitting: Cardiology

## 2022-12-23 ENCOUNTER — Ambulatory Visit: Payer: Medicare HMO | Admitting: Cardiology

## 2022-12-23 VITALS — BP 144/83 | HR 73 | Resp 16 | Ht 62.0 in | Wt 118.0 lb

## 2022-12-23 DIAGNOSIS — I10 Essential (primary) hypertension: Secondary | ICD-10-CM

## 2022-12-23 DIAGNOSIS — E782 Mixed hyperlipidemia: Secondary | ICD-10-CM

## 2022-12-23 DIAGNOSIS — R931 Abnormal findings on diagnostic imaging of heart and coronary circulation: Secondary | ICD-10-CM

## 2022-12-23 NOTE — Progress Notes (Signed)
Patient referred by Tonya Harvey, NP for palpitations  Subjective:   Tonya Thornton, female    DOB: 06-13-1951, 72 y.o.   MRN: [id number]   Chief Complaint  Patient presents with   Hypertension   Hyperlipidemia   Follow-up    3 month     HPI  72 y.o. Tonya Thornton female with hypertension, type 2 diabetes mellitus, hyperlipidemia, solitary kidney, family history of brain aneurysm, palpitations.  Average Systolic BP Level [id number] mmHg Lowest Systolic BP Level 123456 mmHg Highest Systolic BP Level 123456 mmHg   09/30/2022 Friday at 09:13 AM          113 / 78                09/29/2022 Thursday at 09:17 AM     115 / 75                09/28/2022 Wednesday at 10:48 AM 117 / 80                09/27/2022 Tuesday at 08:56 AM      105 / 74                09/27/2022 Tuesday at 08:48 AM      104 / 72                09/26/2022 Monday at 08:45 AM       111 / 76                09/26/2022 Monday at 08:38 AM       111 / 77                09/25/2022 Sunday at 08:35 AM        109 / 72                09/25/2022 Sunday at 08:29 AM        107 / 73                09/24/2022 Saturday at 07:56 AM      106 / 72                09/24/2022 Saturday at 07:38 AM      122 / 75                09/23/2022 Friday at 11:38 AM          16$ 4 / 109  Patient denies chest pain, shortness of breath, palpitations, leg edema, orthopnea, PND, TIA/syncope. She has noticed that blood pressure is elevated when she does not sleep well.  She was up last night with GERD symptoms that improved after drinking apple cider vinegar.  LDL was 42 while she was taking Crestor 40 mg daily.  Since then, she has self reduce Crestor to 20 mg daily.   Initial consultation HPI 02/2020: She has had several complaints over the last year or so. This includes bilareral leg/calf pain, generalized fatigue, intolerance to cold, long standing headache, and now palpitations and elevated blood pressure. Headache and elevated blood pressure  seem to correlate with inadequate sleep. She denies any chest pain or shortness of breath. Patient has uncontrolled hypertension and diabetes. There does seem to be variability to her blood pressure readings, worse after inadequate sleep at night. She was recently told to have PVS on EKG performed by her PCP.   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 Patient enjoys walking, walks up to 2 miles over an hour or so.  She does have family h/o CAD, with her brother having had a MI related cardiac arrest recently. She also has family h.o brain aneurysm, but reportedly underwent brain MRI herself which was negative for aneurysm. She has a lot of anxiety related to her symptoms and worries what would happen if she has similar episodes "while walking on the street".     Current Outpatient Medications:    Calcium Carbonate (CALCIUM 600 PO), Take by mouth., Disp: , Rfl:    Cholecalciferol 25 MCG (1000 UT) capsule, Take 1 capsule by mouth daily., Disp: , Rfl:    ibuprofen (ADVIL) 400 MG tablet, Take 400 mg by mouth every 6 (six) hours as needed., Disp: , Rfl:    lisinopril-hydrochlorothiazide (ZESTORETIC) 10-12.5 MG tablet, Take 1 tablet by mouth daily., Disp: , Rfl:    MAGNESIUM CHLORIDE PO, Take 1 tablet by mouth daily. Every 2 days, Disp: , Rfl:    metFORMIN (GLUCOPHAGE-XR) 500 MG 24 hr tablet, Take 4 tablets by mouth daily., Disp: , Rfl:    metoprolol tartrate (LOPRESSOR) 25 MG tablet, TAKE 1 TABLET TWICE DAILY, Disp: 180 tablet, Rfl: 3   Multiple Vitamins-Minerals (MULTIVITAMIN WITH MINERALS) tablet, Take 1 tablet by mouth daily., Disp: , Rfl:    rosuvastatin (CRESTOR) 40 MG tablet, Take 1 tablet (40 mg total) by mouth daily. (Patient taking differently: Take 20 mg by mouth daily.), Disp: 90 tablet, Rfl: 3   diclofenac Sodium (VOLTAREN) 1 % GEL, APPLY TO AFFECTED AREA 2 GRAMS 2 TO 3 TIMES DAILY AS NEEDED (Patient not taking: Reported on 12/23/2022), Disp: , Rfl:   Cardiovascular and  other pertinent studies:  EKG 06/22/2022: Sinus rhythm 74 bpm Anteroswptal T wave inversion, consider ischemia  Low voltage No change compared to previous EKG  ABI 08/04/2020:  This exam reveals normal perfusion of the right lower extremity (ABI 0.98)  and mildly decreased perfusion of the left lower extremity, noted at the  posterior and anterior tibial artery level (ABI 0.81). There is normal  triphasic waveform in all the vessels at the ankles except left PT,  biphasic waveform.  Real time outpatient cardiac telemetry 02/26/2020: Monitoring period 319 hr 58 min Dominant rhythm: Sinus. HR 57-117 bpm. Avg HR 75 bpm. Ventricular ectopy 1.3%, with isolated VE and one episode of 6 beat NSVT Rare supraventricular ectopy No atrial fibrillation/atrial flutter/SVT/VT/high grade AV block, sinus pause >3sec noted. Symptoms reported: None   Echocardiogram 02/26/2020:    Normal LV systolic function with EF 61%. Left ventricle cavity is normal  in size. Normal left ventricular wall thickness. Normal global wall  motion. Normal diastolic filling pattern. Calculated EF 61%.  Trileaflet aortic valve.  Trace aortic regurgitation.  Structurally normal mitral valve.  Mild (Grade I) mitral regurgitation.  CT cardiac scoring 02/25/2020: Total score: 102 LM: 0 LAD: 86 LCx: 0 RCA: 16   Recent labs: 10/04/2022: Glucose 130, BUN/Cr 8/0.67. EGFR >90. Na/K 138/3.8. Rest of the CMP normal H/H 12/38. MCV 85. Platelets 219 HbA1C 7.6% Chol 105, TG 64, HDL 50, LDL 42 TSH 1.37 normal Renin, aldosterone, Ur metanephrine 24 hr, Ur VMA 24 hr normal   06/28/2022: Glucose 128, BUN/Cr 11/0.65. EGFR >90. Na/K 137/4.6. Rest of the CMP normal H/H 12/36. MCV 86. Platelets 194 HbA1C 7.3% Ferritin 13 (20-200) Chol 171, TG 68, HDL 60, LDL 97  03/22/2022: Glucose 120, BUN/Cr 17/0.8. EGFR 79. Na/K 136/4.4. Rest of the CMP normal HbA1C  7.6%  09/2021: H/H 12/37. MCV 83. Platelets 213 Chol 166, TG 95, HDL 56,  LDL 91 HbA1C 8.1%  02/13/2020: Chol 170, TG 97, HDL 57, LDL 94   10/2019: HbA1C 7.8%  Review of Systems  Cardiovascular:  Negative for chest pain, dyspnea on exertion, leg swelling, palpitations and syncope.  Neurological:  Positive for headaches.  Psychiatric/Behavioral:  The patient is nervous/anxious.         Vitals:   12/23/22 1311 12/23/22 1314  BP: (!) 164/90 (!) 144/83  Pulse: 78 73  Resp: 16   SpO2: 100% 100%     Body mass index is 21.58 kg/m. Filed Weights   12/23/22 1311  Weight: 118 lb (53.5 kg)     Objective:   Physical Exam Vitals and nursing note reviewed.  Constitutional:      Appearance: She is well-developed.  Neck:     Vascular: No JVD.  Cardiovascular:     Rate and Rhythm: Normal rate and regular rhythm.     Pulses: Intact distal pulses.          Dorsalis pedis pulses are 2+ on the right side and 2+ on the left side.       Posterior tibial pulses are 2+ on the right side and 2+ on the left side.     Heart sounds: Normal heart sounds. No murmur heard. Pulmonary:     Effort: Pulmonary effort is normal.     Breath sounds: Normal breath sounds. No wheezing or rales.  Musculoskeletal:     Right lower leg: Edema (1+) present.     Left lower leg: Edema (1+) present.  Skin:    Comments: Prominent varicocities        Assessment & Recommendations:   72 y.o. Tonya Thornton female with hypertension, type 2 diabetes mellitus, hyperlipidemia, solitary kidney, family history of brain aneurysm, with  elevated calcium score, symptomatic PVC's  Elevated calcium score: No angina symptoms. Recommend aggressive medial therapy.  LDL 42 on Crestor 40 mg daily.  However, she has now been taking Crestor 20 mg daily.  Check lipid panel.  If LDL >70, I would go back to Crestor 40 mg daily.  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  Hypertension: Occasional elevated blood pressure, like today, combination of whitecoat hypertension as  well as lack of sleep.  Her home monitoring has yielded normal blood pressure readings.  Secondary hypertension workup has been unremarkable.  Continue current antihypertensive therapy.    Type 2 DM: A1C 7.6%. Continue management as per PCP.  PAD: Very mild.  Continue aggressive risk factor modification as above.  F/u in 1 year     Nigel Mormon, MD Pager: [phone] Office: [phone]

## 2023-01-28 ENCOUNTER — Other Ambulatory Visit: Payer: Self-pay | Admitting: Cardiology

## 2023-06-26 ENCOUNTER — Other Ambulatory Visit: Payer: Self-pay | Admitting: Cardiology

## 2023-06-26 DIAGNOSIS — R002 Palpitations: Secondary | ICD-10-CM

## 2023-06-26 DIAGNOSIS — I10 Essential (primary) hypertension: Secondary | ICD-10-CM

## 2023-11-02 IMAGING — RF DG ESOPHAGUS
3 series · 15 of 24 positions shown · non-contrast
Comparison: None.

CLINICAL DATA: Difficulty swallowing with food sticking.

EXAM:
ESOPHOGRAM / BARIUM SWALLOW / BARIUM TABLET STUDY
TECHNIQUE: Combined double contrast and single contrast examination performed
using effervescent crystals, thick barium liquid, and thin barium
liquid. The patient was observed with fluoroscopy swallowing a 13 mm
barium sulphate tablet.
FLUOROSCOPY:
Radiation Exposure Index (as provided by the fluoroscopic device):
6.5 mGy Kerma

[Series 1: sequence · 0.31mm/px · 2 of 6 frames shown (1 of 2)]
[frame 1/6]
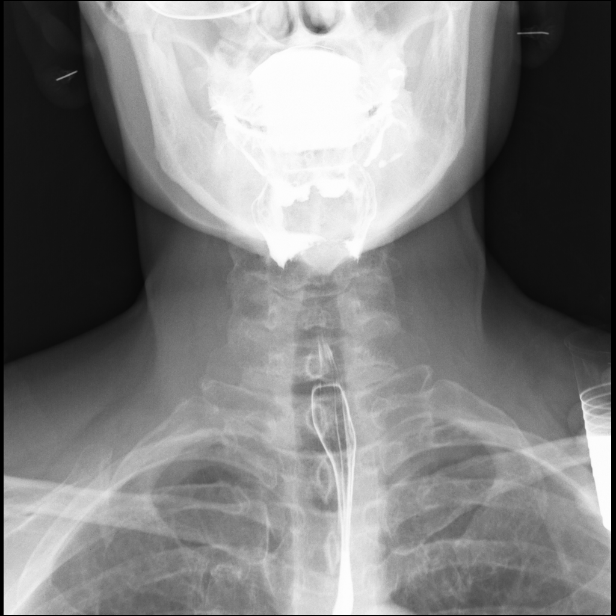
[frame 6/6]
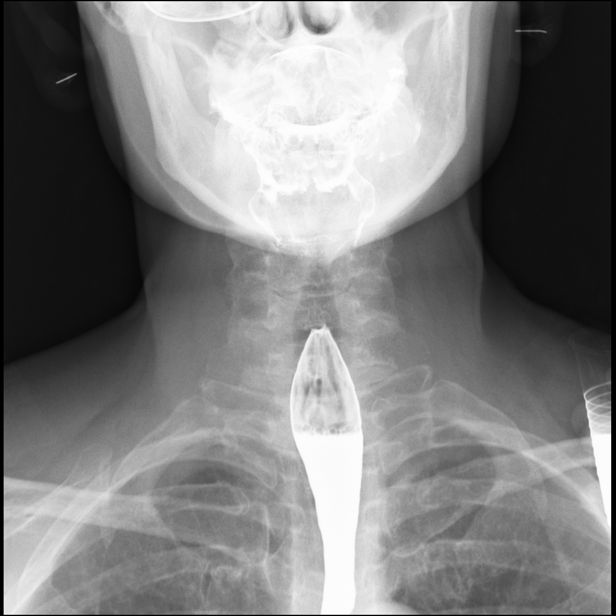

[Series 2: sequence · 0.31mm/px · 2 of 8 frames shown (2 of 2)]
[frame 5/8]
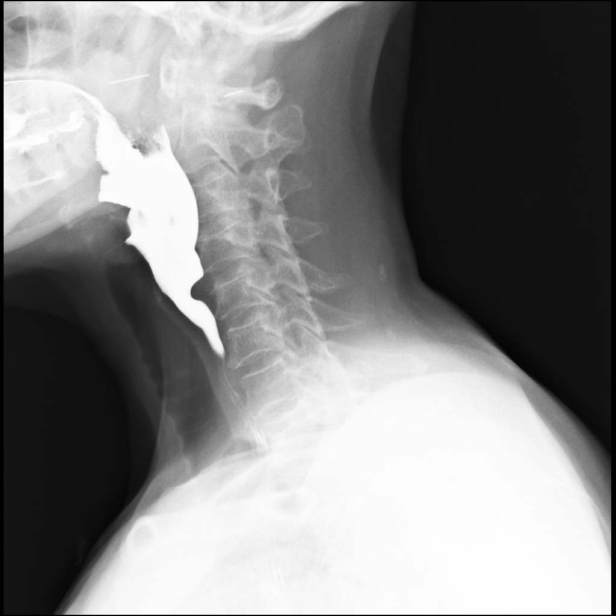
[frame 7/8]
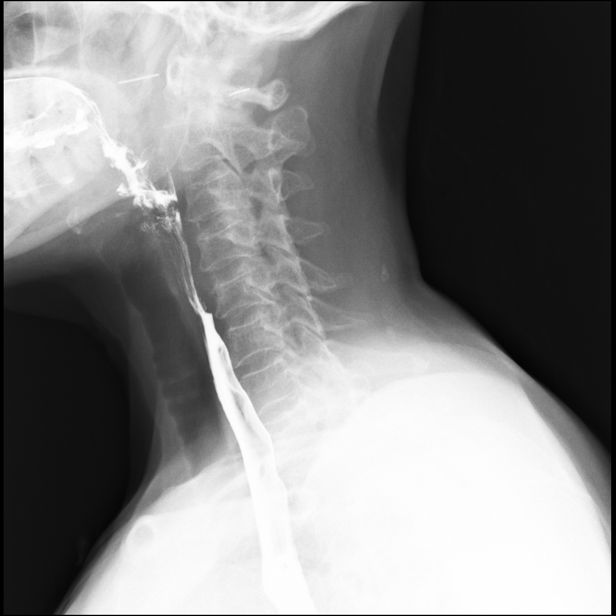

[Series 3: one shot · 11 of 23 slices shown]
[im 2/23]
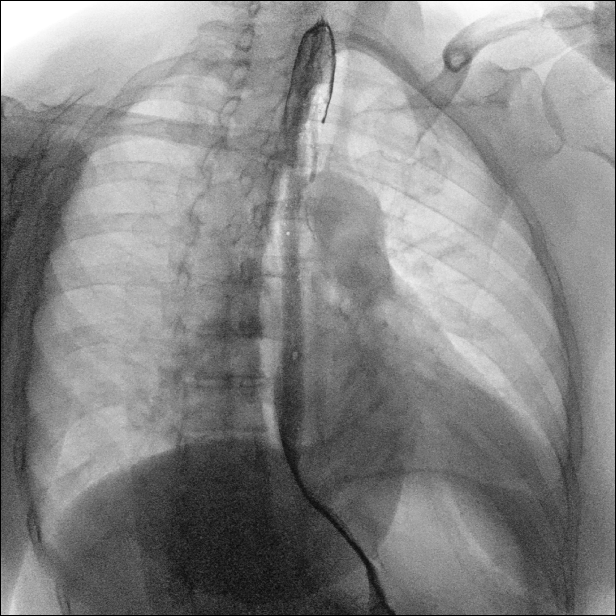
[im 3/23]
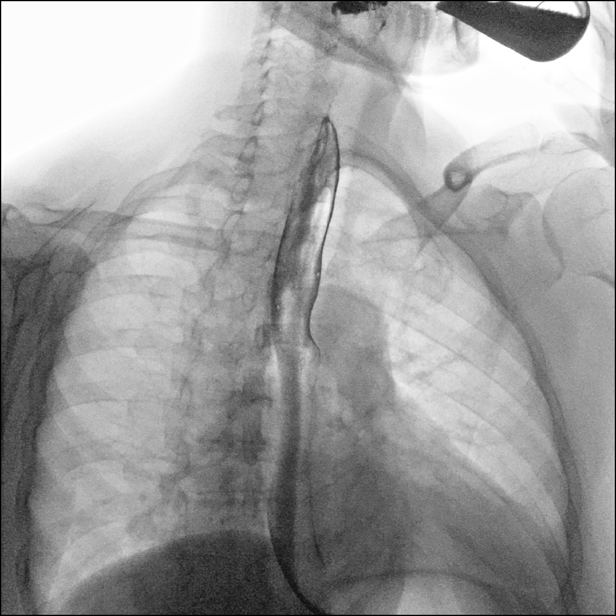
[im 6/23]
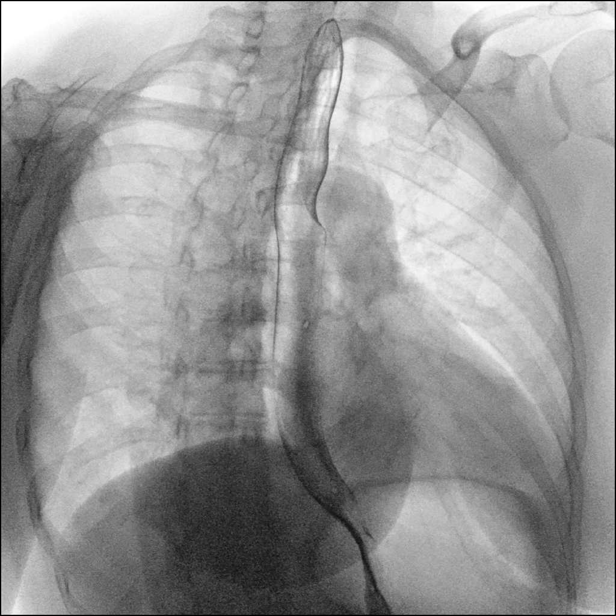
[im 8/23]
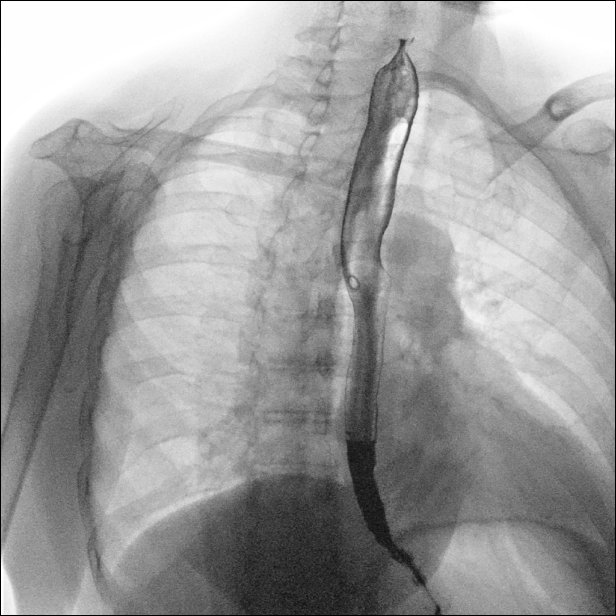
[im 9/23]
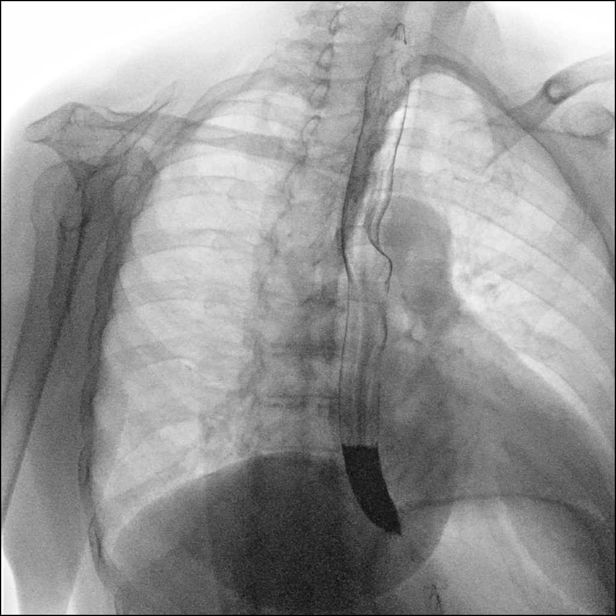
[im 12/23]
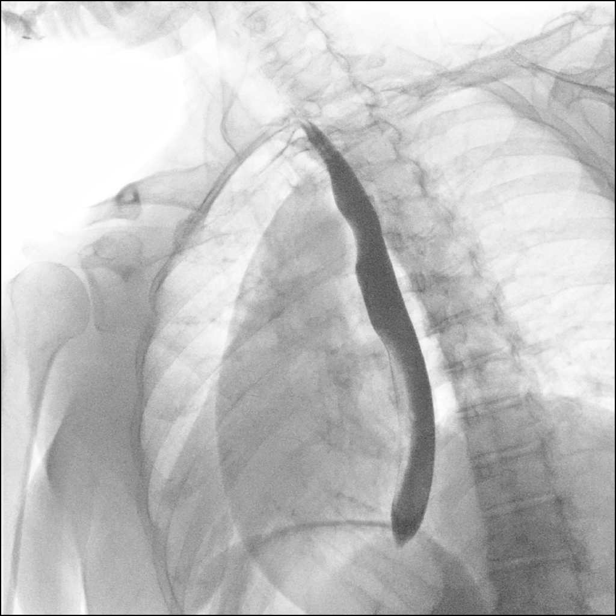
[im 13/23]
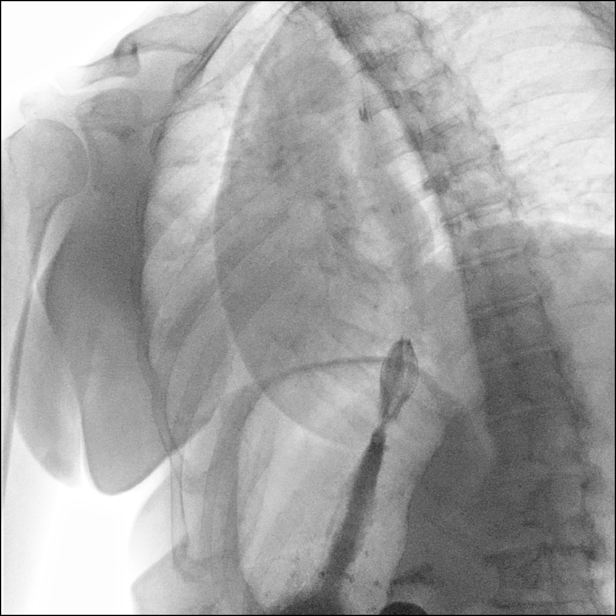
[im 16/23]
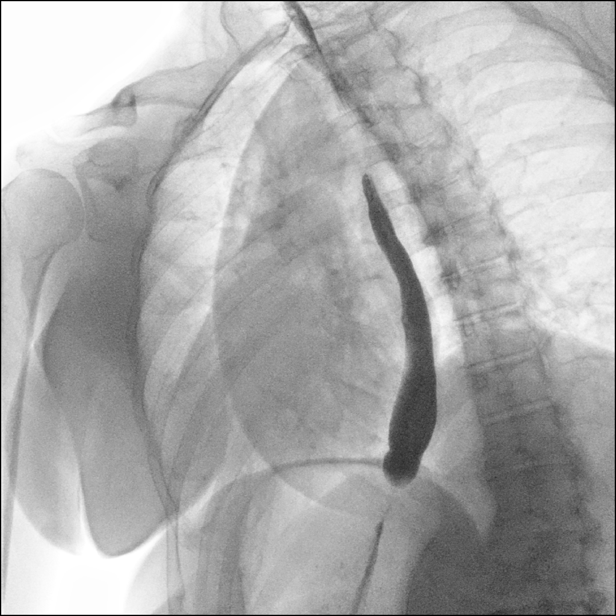
[im 18/23]
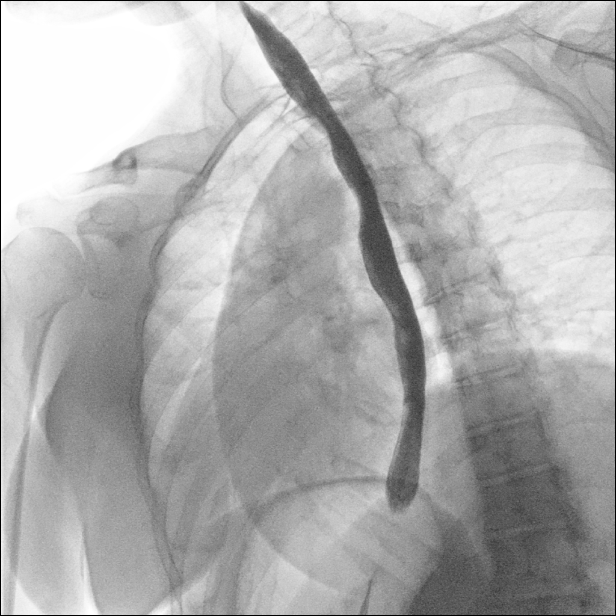
[im 19/23]
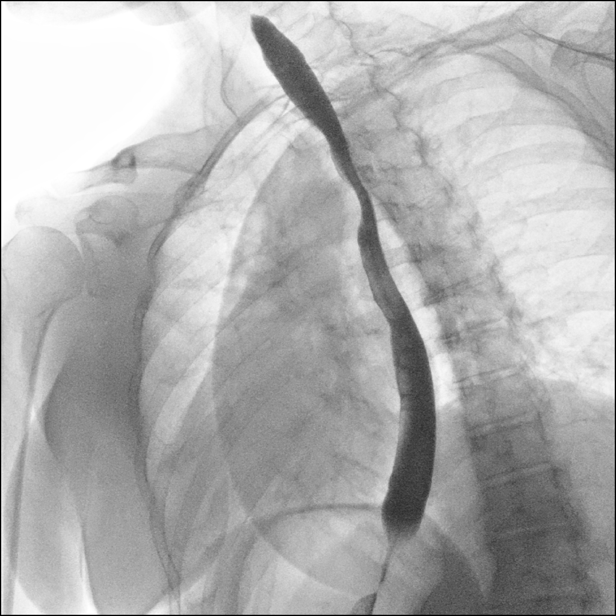
[im 23/23]
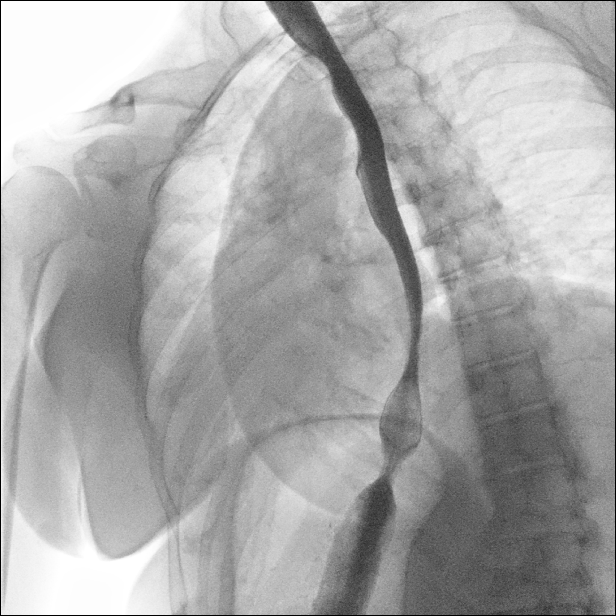

[15 of 24 positions shown; findings below may reference images not displayed]

FINDINGS: Normal swallowing mechanism. No esophageal fold thickening,
stricture or obstruction. Normal esophageal motility. A 13 mm barium
tablet passed into the stomach without difficulty.
IMPRESSION: Normal exam.

## 2023-11-19 ENCOUNTER — Other Ambulatory Visit: Payer: Self-pay | Admitting: Cardiology

## 2024-02-02 ENCOUNTER — Other Ambulatory Visit: Payer: Self-pay | Admitting: Cardiology

## 2024-02-26 ENCOUNTER — Other Ambulatory Visit: Payer: Self-pay | Admitting: Cardiology

## 2024-04-14 ENCOUNTER — Other Ambulatory Visit: Payer: Self-pay | Admitting: Cardiology

## 2024-04-14 DIAGNOSIS — I10 Essential (primary) hypertension: Secondary | ICD-10-CM

## 2024-04-14 DIAGNOSIS — R002 Palpitations: Secondary | ICD-10-CM

## 2024-04-15 ENCOUNTER — Other Ambulatory Visit: Payer: Self-pay | Admitting: Cardiology

## 2024-08-05 ENCOUNTER — Other Ambulatory Visit: Payer: Self-pay | Admitting: Cardiology

## 2024-08-05 DIAGNOSIS — R002 Palpitations: Secondary | ICD-10-CM

## 2024-08-05 DIAGNOSIS — I10 Essential (primary) hypertension: Secondary | ICD-10-CM
